# Patient Record
Sex: Male | Born: 1940 | Race: White | Hispanic: No | Marital: Single | Smoking: Never smoker
Health system: Southern US, Community
[De-identification: ages and names within clinical notes are randomized; demographics above are authoritative.]

## PROBLEM LIST (undated history)

## (undated) DIAGNOSIS — I10 Essential (primary) hypertension: Secondary | ICD-10-CM

## (undated) DIAGNOSIS — J45909 Unspecified asthma, uncomplicated: Secondary | ICD-10-CM

## (undated) DIAGNOSIS — M1990 Unspecified osteoarthritis, unspecified site: Secondary | ICD-10-CM

## (undated) DIAGNOSIS — R32 Unspecified urinary incontinence: Secondary | ICD-10-CM

## (undated) DIAGNOSIS — D649 Anemia, unspecified: Secondary | ICD-10-CM

## (undated) DIAGNOSIS — C801 Malignant (primary) neoplasm, unspecified: Secondary | ICD-10-CM

## (undated) HISTORY — PX: ESOPHAGOGASTRODUODENOSCOPY: SHX1529

## (undated) HISTORY — PX: COLONOSCOPY: SHX5424

## (undated) HISTORY — PX: COLOSTOMY: SHX63

## (undated) HISTORY — PX: CATARACT EXTRACTION: SUR2

---

## 2020-08-26 ENCOUNTER — Other Ambulatory Visit: Payer: Self-pay

## 2020-08-26 ENCOUNTER — Encounter: Payer: Self-pay | Admitting: Plastic Surgery

## 2020-08-26 ENCOUNTER — Ambulatory Visit (INDEPENDENT_AMBULATORY_CARE_PROVIDER_SITE_OTHER): Payer: Medicare Other | Admitting: Plastic Surgery

## 2020-08-26 VITALS — BP 138/72 | HR 89 | Ht 71.0 in | Wt 159.8 lb

## 2020-08-26 DIAGNOSIS — S61401A Unspecified open wound of right hand, initial encounter: Secondary | ICD-10-CM

## 2020-08-26 NOTE — H&P (View-Only) (Signed)
   Referring Provider No referring provider defined for this encounter.   CC:  Chief Complaint  Patient presents with  . Advice Only      Tony Fleming is an 80 y.o. male.  HPI: Patient presents with a dorsal right hand defect.  He says he has had a lesion on his dorsal hand for quite some time.  Apparently was excised somewhere else initially but a wound developed and it never healed.  He got wound care at an outside wound care center for period of time until he was sent to Dr. Winifred Olive.  He performed very aggressive resection yesterday exposing tendons and bone and feels like he obtain negative margins but it did go down to require the tumor being peeled off the tendon and bones.  He seem to be for reconstruction feels like he needs radiation as well.  Allergies  Allergen Reactions  . Penicillins Other (See Comments)    Tolerated ceftriaxone at OSH     Outpatient Encounter Medications as of 08/26/2020  Medication Sig  . traMADol (ULTRAM) 50 MG tablet Take 1 tablet by mouth every 6 (six) hours as needed.   No facility-administered encounter medications on file as of 08/26/2020.     No past medical history on file.  No family history on file.  Social History   Social History Narrative  . Not on file     Review of Systems General: Denies fevers, chills, weight loss CV: Denies chest pain, shortness of breath, palpitations  Physical Exam Vitals with BMI 08/26/2020  Height 5\' 11"   Weight 159 lbs 13 oz  BMI 77.8  Systolic 242  Diastolic 72  Pulse 89    General:  No acute distress,  Alert and oriented, Non-Toxic, Normal speech and affect Right hand: Fingers well-perfused with normal capillary refill to palp radial pulse.  Sensation intact throughout.  He has limited flexion and extension of his fingers.  He and his niece stated he has some stiffness from being immobilized for a long period of time while wound care is being done prior to his Mohs excision.  There is a large  dorsal defect with exposure of the extensor tendons.  All the extensor tendons do appear to be intact.  I estimate the size of the defect is 10 x 10 cm. Abdomen: Abdomen is soft nontender.  He has a colostomy bag on the left side.  There is no scars in the right groin or lower abdomen.  Assessment/Plan Patient presents with a dorsal right hand defect.  This is a complex defect with exposed tendon and bone.  Given that he has been recommended undergo radiation therapy I think a flap would be required for him.  I discussed abdominal and groin flap which would be pedicled.  I explained his hand will be sewn to the flap for at least 3 weeks and then will be divided at a later time.  He has limited hearing but is otherwise healthy.  He would require assistance postoperatively with activities of daily living and I think we need to be in a skilled nursing facility.  I discussed the risk of the procedure with him and his niece that include bleeding, infection, damage to surrounding structures and need for additional procedures.  All her questions were answered and we will plan to move forward.  [name] 08/26/2020, 4:07 PM

## 2020-08-26 NOTE — Progress Notes (Signed)
   Referring Provider No referring provider defined for this encounter.   CC:  Chief Complaint  Patient presents with  . Advice Only      Tony Fleming is an 80 y.o. male.  HPI: Patient presents with a dorsal right hand defect.  He says he has had a lesion on his dorsal hand for quite some time.  Apparently was excised somewhere else initially but a wound developed and it never healed.  He got wound care at an outside wound care center for period of time until he was sent to Dr. Mitkov.  He performed very aggressive resection yesterday exposing tendons and bone and feels like he obtain negative margins but it did go down to require the tumor being peeled off the tendon and bones.  He seem to be for reconstruction feels like he needs radiation as well.  Allergies  Allergen Reactions  . Penicillins Other (See Comments)    Tolerated ceftriaxone at OSH     Outpatient Encounter Medications as of 08/26/2020  Medication Sig  . traMADol (ULTRAM) 50 MG tablet Take 1 tablet by mouth every 6 (six) hours as needed.   No facility-administered encounter medications on file as of 08/26/2020.     No past medical history on file.  No family history on file.  Social History   Social History Narrative  . Not on file     Review of Systems General: Denies fevers, chills, weight loss CV: Denies chest pain, shortness of breath, palpitations  Physical Exam Vitals with BMI 08/26/2020  Height 5' 11"  Weight 159 lbs 13 oz  BMI 22.3  Systolic 138  Diastolic 72  Pulse 89    General:  No acute distress,  Alert and oriented, Non-Toxic, Normal speech and affect Right hand: Fingers well-perfused with normal capillary refill to palp radial pulse.  Sensation intact throughout.  He has limited flexion and extension of his fingers.  He and his niece stated he has some stiffness from being immobilized for a long period of time while wound care is being done prior to his Mohs excision.  There is a large  dorsal defect with exposure of the extensor tendons.  All the extensor tendons do appear to be intact.  I estimate the size of the defect is 10 x 10 cm. Abdomen: Abdomen is soft nontender.  He has a colostomy bag on the left side.  There is no scars in the right groin or lower abdomen.  Assessment/Plan Patient presents with a dorsal right hand defect.  This is a complex defect with exposed tendon and bone.  Given that he has been recommended undergo radiation therapy I think a flap would be required for him.  I discussed abdominal and groin flap which would be pedicled.  I explained his hand will be sewn to the flap for at least 3 weeks and then will be divided at a later time.  He has limited hearing but is otherwise healthy.  He would require assistance postoperatively with activities of daily living and I think we need to be in a skilled nursing facility.  I discussed the risk of the procedure with him and his niece that include bleeding, infection, damage to surrounding structures and need for additional procedures.  All her questions were answered and we will plan to move forward.  Kiffany Schelling S Nason Conradt 08/26/2020, 4:07 PM      

## 2020-08-27 ENCOUNTER — Other Ambulatory Visit: Payer: Self-pay

## 2020-08-27 ENCOUNTER — Encounter (HOSPITAL_COMMUNITY): Payer: Self-pay | Admitting: Plastic Surgery

## 2020-08-27 NOTE — Progress Notes (Signed)
PCP - Dr Manuela Schwartz - Cori Razor, [place] Cardiologist - denies   Chest x-ray - not needed EKG - DOS Stress Test - denies ECHO -  Cardiac Cath -    COVID TEST- needs DOS   Anesthesia review: NO, Needs Care Management consult for SNF placement  Patient denies shortness of breath, fever, cough and chest pain at PAT appointment   All instructions explained to the patient, with a verbal understanding of the material. Patient agrees to go over the instructions while at home for a better understanding. Patient also instructed to self quarantine after being tested for COVID-19. The opportunity to ask questions was provided.

## 2020-08-30 ENCOUNTER — Other Ambulatory Visit: Payer: Self-pay

## 2020-08-30 ENCOUNTER — Inpatient Hospital Stay (HOSPITAL_COMMUNITY): Payer: Medicare Other | Admitting: Certified Registered Nurse Anesthetist

## 2020-08-30 ENCOUNTER — Encounter (HOSPITAL_COMMUNITY): Payer: Self-pay | Admitting: Plastic Surgery

## 2020-08-30 ENCOUNTER — Encounter (HOSPITAL_COMMUNITY)
Admission: RE | Disposition: A | Discharge status: Skilled Nursing Facility | Payer: Self-pay | Source: Home / Self Care | Attending: Plastic Surgery

## 2020-08-30 ENCOUNTER — Inpatient Hospital Stay (HOSPITAL_COMMUNITY)
Admission: RE | Admit: 2020-08-30 | Discharge: 2020-09-13 | DRG: 578 | Disposition: A | Discharge status: Skilled Nursing Facility | Payer: Medicare Other | Attending: Plastic Surgery | Admitting: Plastic Surgery

## 2020-08-30 DIAGNOSIS — R198 Other specified symptoms and signs involving the digestive system and abdomen: Secondary | ICD-10-CM

## 2020-08-30 DIAGNOSIS — M1990 Unspecified osteoarthritis, unspecified site: Secondary | ICD-10-CM | POA: Diagnosis present

## 2020-08-30 DIAGNOSIS — Z79899 Other long term (current) drug therapy: Secondary | ICD-10-CM | POA: Diagnosis not present

## 2020-08-30 DIAGNOSIS — Z79891 Long term (current) use of opiate analgesic: Secondary | ICD-10-CM

## 2020-08-30 DIAGNOSIS — C44391 Other specified malignant neoplasm of skin of nose: Secondary | ICD-10-CM | POA: Diagnosis present

## 2020-08-30 DIAGNOSIS — I10 Essential (primary) hypertension: Secondary | ICD-10-CM | POA: Diagnosis present

## 2020-08-30 DIAGNOSIS — Z20822 Contact with and (suspected) exposure to covid-19: Secondary | ICD-10-CM | POA: Diagnosis present

## 2020-08-30 DIAGNOSIS — Z8572 Personal history of non-Hodgkin lymphomas: Secondary | ICD-10-CM

## 2020-08-30 DIAGNOSIS — Z933 Colostomy status: Secondary | ICD-10-CM | POA: Diagnosis not present

## 2020-08-30 DIAGNOSIS — R110 Nausea: Secondary | ICD-10-CM

## 2020-08-30 DIAGNOSIS — K5900 Constipation, unspecified: Secondary | ICD-10-CM | POA: Diagnosis present

## 2020-08-30 DIAGNOSIS — S61401D Unspecified open wound of right hand, subsequent encounter: Secondary | ICD-10-CM | POA: Diagnosis present

## 2020-08-30 DIAGNOSIS — S61401A Unspecified open wound of right hand, initial encounter: Secondary | ICD-10-CM | POA: Diagnosis present

## 2020-08-30 HISTORY — DX: Essential (primary) hypertension: I10

## 2020-08-30 HISTORY — DX: Unspecified urinary incontinence: R32

## 2020-08-30 HISTORY — DX: Unspecified osteoarthritis, unspecified site: M19.90

## 2020-08-30 HISTORY — DX: Unspecified asthma, uncomplicated: J45.909

## 2020-08-30 HISTORY — PX: MUSCLE FLAP CLOSURE: SHX2054

## 2020-08-30 HISTORY — DX: Anemia, unspecified: D64.9

## 2020-08-30 HISTORY — PX: DEBRIDEMENT AND CLOSURE WOUND: SHX5614

## 2020-08-30 HISTORY — DX: Malignant (primary) neoplasm, unspecified: C80.1

## 2020-08-30 LAB — CBC
HCT: 31.4 % — ABNORMAL LOW (ref 39.0–52.0)
Hemoglobin: 10.4 g/dL — ABNORMAL LOW (ref 13.0–17.0)
MCH: 31.2 pg (ref 26.0–34.0)
MCHC: 33.1 g/dL (ref 30.0–36.0)
MCV: 94.3 fL (ref 80.0–100.0)
Platelets: 185 10*3/uL (ref 150–400)
RBC: 3.33 MIL/uL — ABNORMAL LOW (ref 4.22–5.81)
RDW: 13.4 % (ref 11.5–15.5)
WBC: 5.2 10*3/uL (ref 4.0–10.5)
nRBC: 0 % (ref 0.0–0.2)

## 2020-08-30 LAB — RESP PANEL BY RT-PCR (FLU A&B, COVID) ARPGX2
Influenza A by PCR: NEGATIVE
Influenza B by PCR: NEGATIVE
SARS Coronavirus 2 by RT PCR: NEGATIVE

## 2020-08-30 LAB — BASIC METABOLIC PANEL
Anion gap: 5 (ref 5–15)
BUN: 16 mg/dL (ref 8–23)
CO2: 26 mmol/L (ref 22–32)
Calcium: 8.6 mg/dL — ABNORMAL LOW (ref 8.9–10.3)
Chloride: 107 mmol/L (ref 98–111)
Creatinine, Ser: 1.15 mg/dL (ref 0.61–1.24)
GFR, Estimated: >60 mL/min (ref >60)
Glucose, Bld: 100 mg/dL — ABNORMAL HIGH (ref 70–99)
Potassium: 3.5 mmol/L (ref 3.5–5.1)
Sodium: 138 mmol/L (ref 135–145)

## 2020-08-30 SURGERY — CLOSURE, WOUND, USING MUSCLE FLAP
Anesthesia: General | Site: Hand | Laterality: Right

## 2020-08-30 MED ORDER — ROCURONIUM BROMIDE 10 MG/ML (PF) SYRINGE
PREFILLED_SYRINGE | INTRAVENOUS | Status: AC
  Filled 2020-08-30: qty 10

## 2020-08-30 MED ORDER — ONDANSETRON 4 MG PO TBDP
4.0000 mg | ORAL_TABLET | Freq: Four times a day (QID) | ORAL | Status: DC | PRN
  Filled 2020-08-30: qty 1

## 2020-08-30 MED ORDER — HYDROCODONE-ACETAMINOPHEN 5-325 MG PO TABS
1.0000 | ORAL_TABLET | ORAL | Status: DC | PRN
  Administered 2020-08-30: 1 via ORAL
  Administered 2020-08-31: 2 via ORAL
  Administered 2020-09-01 – 2020-09-03 (×3): 1 via ORAL
  Administered 2020-09-03: 2 via ORAL
  Administered 2020-09-04 – 2020-09-10 (×5): 1 via ORAL
  Filled 2020-08-30 (×4): qty 1
  Filled 2020-08-30: qty 2
  Filled 2020-08-30 (×5): qty 1
  Filled 2020-08-30: qty 2

## 2020-08-30 MED ORDER — ONDANSETRON HCL 4 MG/2ML IJ SOLN
INTRAMUSCULAR | Status: DC | PRN
  Administered 2020-08-30: 4 mg via INTRAVENOUS

## 2020-08-30 MED ORDER — ALBUMIN HUMAN 5 % IV SOLN: INTRAVENOUS | Status: DC | PRN

## 2020-08-30 MED ORDER — DEXAMETHASONE SODIUM PHOSPHATE 10 MG/ML IJ SOLN
INTRAMUSCULAR | Status: DC | PRN
  Administered 2020-08-30: 5 mg via INTRAVENOUS

## 2020-08-30 MED ORDER — ONDANSETRON 4 MG PO TBDP: 4.0000 mg | ORAL_TABLET | Freq: Four times a day (QID) | ORAL | Status: DC | PRN

## 2020-08-30 MED ORDER — FENTANYL CITRATE (PF) 100 MCG/2ML IJ SOLN: 25.0000 ug | INTRAMUSCULAR | Status: DC | PRN

## 2020-08-30 MED ORDER — PROPOFOL 10 MG/ML IV BOLUS
INTRAVENOUS | Status: AC
  Filled 2020-08-30: qty 20

## 2020-08-30 MED ORDER — BUPIVACAINE-EPINEPHRINE (PF) 0.25% -1:200000 IJ SOLN
INTRAMUSCULAR | Status: AC
  Filled 2020-08-30: qty 30

## 2020-08-30 MED ORDER — CHLORHEXIDINE GLUCONATE 0.12 % MT SOLN
15.0000 mL | Freq: Once | OROMUCOSAL | Status: AC
  Administered 2020-08-30: 30 mL via OROMUCOSAL
  Filled 2020-08-30: qty 15

## 2020-08-30 MED ORDER — ACETAMINOPHEN 325 MG PO TABS
325.0000 mg | ORAL_TABLET | Freq: Four times a day (QID) | ORAL | Status: DC
  Administered 2020-08-30 – 2020-09-13 (×44): 325 mg via ORAL
  Filled 2020-08-30 (×46): qty 1

## 2020-08-30 MED ORDER — IBUPROFEN 200 MG PO TABS
600.0000 mg | ORAL_TABLET | Freq: Four times a day (QID) | ORAL | Status: DC | PRN
  Administered 2020-09-05 – 2020-09-06 (×2): 600 mg via ORAL
  Filled 2020-08-30 (×2): qty 3

## 2020-08-30 MED ORDER — MORPHINE SULFATE (PF) 2 MG/ML IV SOLN: 1.0000 mg | INTRAVENOUS | Status: DC | PRN

## 2020-08-30 MED ORDER — ORAL CARE MOUTH RINSE: 15.0000 mL | Freq: Once | OROMUCOSAL | Status: AC

## 2020-08-30 MED ORDER — DEXAMETHASONE SODIUM PHOSPHATE 10 MG/ML IJ SOLN
INTRAMUSCULAR | Status: AC
  Filled 2020-08-30: qty 3

## 2020-08-30 MED ORDER — PHENYLEPHRINE 40 MCG/ML (10ML) SYRINGE FOR IV PUSH (FOR BLOOD PRESSURE SUPPORT)
PREFILLED_SYRINGE | INTRAVENOUS | Status: AC
  Filled 2020-08-30: qty 10

## 2020-08-30 MED ORDER — LIDOCAINE 2% (20 MG/ML) 5 ML SYRINGE
INTRAMUSCULAR | Status: AC
  Filled 2020-08-30: qty 10

## 2020-08-30 MED ORDER — CLINDAMYCIN PHOSPHATE 900 MG/50ML IV SOLN
900.0000 mg | INTRAVENOUS | Status: AC
  Administered 2020-08-30: 900 mg via INTRAVENOUS
  Filled 2020-08-30: qty 50

## 2020-08-30 MED ORDER — PROPOFOL 10 MG/ML IV BOLUS
INTRAVENOUS | Status: DC | PRN
  Administered 2020-08-30: 130 mg via INTRAVENOUS

## 2020-08-30 MED ORDER — LIDOCAINE-EPINEPHRINE 1 %-1:100000 IJ SOLN
INTRAMUSCULAR | Status: DC | PRN
  Administered 2020-08-30: 20 mL

## 2020-08-30 MED ORDER — FENTANYL CITRATE (PF) 250 MCG/5ML IJ SOLN
INTRAMUSCULAR | Status: AC
  Filled 2020-08-30: qty 5

## 2020-08-30 MED ORDER — LISINOPRIL 10 MG PO TABS
10.0000 mg | ORAL_TABLET | Freq: Every day | ORAL | Status: DC
  Administered 2020-08-30 – 2020-09-13 (×15): 10 mg via ORAL
  Filled 2020-08-30 (×15): qty 1

## 2020-08-30 MED ORDER — POLYETHYLENE GLYCOL 3350 17 G PO PACK: 17.0000 g | PACK | Freq: Every day | ORAL | Status: DC | PRN

## 2020-08-30 MED ORDER — LACTATED RINGERS IV SOLN: INTRAVENOUS | Status: DC

## 2020-08-30 MED ORDER — AMISULPRIDE (ANTIEMETIC) 5 MG/2ML IV SOLN: 10.0000 mg | Freq: Once | INTRAVENOUS | Status: DC | PRN

## 2020-08-30 MED ORDER — HYDROCODONE-ACETAMINOPHEN 5-325 MG PO TABS: 1.0000 | ORAL_TABLET | Freq: Four times a day (QID) | ORAL | Status: DC | PRN

## 2020-08-30 MED ORDER — LIDOCAINE-EPINEPHRINE 1 %-1:100000 IJ SOLN
INTRAMUSCULAR | Status: AC
  Filled 2020-08-30: qty 1

## 2020-08-30 MED ORDER — EPHEDRINE SULFATE-NACL 50-0.9 MG/10ML-% IV SOSY
PREFILLED_SYRINGE | INTRAVENOUS | Status: DC | PRN
  Administered 2020-08-30 (×3): 5 mg via INTRAVENOUS

## 2020-08-30 MED ORDER — BUPIVACAINE-EPINEPHRINE (PF) 0.25% -1:200000 IJ SOLN
INTRAMUSCULAR | Status: DC | PRN
  Administered 2020-08-30: 30 mL

## 2020-08-30 MED ORDER — ENOXAPARIN SODIUM 40 MG/0.4ML IJ SOSY: 40.0000 mg | PREFILLED_SYRINGE | INTRAMUSCULAR | Status: DC

## 2020-08-30 MED ORDER — MECLIZINE HCL 12.5 MG PO TABS
12.5000 mg | ORAL_TABLET | Freq: Three times a day (TID) | ORAL | Status: DC | PRN
  Filled 2020-08-30: qty 1

## 2020-08-30 MED ORDER — ONDANSETRON HCL 4 MG/2ML IJ SOLN: 4.0000 mg | Freq: Once | INTRAMUSCULAR | Status: DC | PRN

## 2020-08-30 MED ORDER — LIDOCAINE 2% (20 MG/ML) 5 ML SYRINGE
INTRAMUSCULAR | Status: DC | PRN
  Administered 2020-08-30: 80 mg via INTRAVENOUS

## 2020-08-30 MED ORDER — SODIUM CHLORIDE 0.9 % IR SOLN
Status: DC | PRN
  Administered 2020-08-30: 3000 mL

## 2020-08-30 MED ORDER — ONDANSETRON HCL 4 MG/2ML IJ SOLN
INTRAMUSCULAR | Status: AC
  Filled 2020-08-30: qty 6

## 2020-08-30 MED ORDER — ONDANSETRON HCL 4 MG/2ML IJ SOLN: 4.0000 mg | Freq: Four times a day (QID) | INTRAMUSCULAR | Status: DC | PRN

## 2020-08-30 MED ORDER — PHENYLEPHRINE 40 MCG/ML (10ML) SYRINGE FOR IV PUSH (FOR BLOOD PRESSURE SUPPORT)
PREFILLED_SYRINGE | INTRAVENOUS | Status: DC | PRN
  Administered 2020-08-30 (×3): 40 ug via INTRAVENOUS

## 2020-08-30 MED ORDER — ACETAMINOPHEN 10 MG/ML IV SOLN: 1000.0000 mg | Freq: Once | INTRAVENOUS | Status: DC | PRN

## 2020-08-30 MED ORDER — FENTANYL CITRATE (PF) 250 MCG/5ML IJ SOLN
INTRAMUSCULAR | Status: DC | PRN
  Administered 2020-08-30 (×2): 25 ug via INTRAVENOUS

## 2020-08-30 SURGICAL SUPPLY — 102 items
ADH SKN CLS APL DERMABOND .7 (GAUZE/BANDAGES/DRESSINGS)
APL PRP STRL LF DISP 70% ISPRP (MISCELLANEOUS)
APL SKNCLS STERI-STRIP NONHPOA (GAUZE/BANDAGES/DRESSINGS) ×2
APPLIER CLIP 9.375 MED OPEN (MISCELLANEOUS)
APR CLP MED 9.3 20 MLT OPN (MISCELLANEOUS)
BENZOIN TINCTURE PRP APPL 2/3 (GAUZE/BANDAGES/DRESSINGS) ×3 IMPLANT
BINDER ABDOMINAL 12 ML 46-62 (SOFTGOODS) ×6 IMPLANT
BIOPATCH RED 1 DISK 7.0 (GAUZE/BANDAGES/DRESSINGS) IMPLANT
BLADE CLIPPER SURG (BLADE) ×3 IMPLANT
BLADE SURG 10 STRL SS (BLADE) ×3 IMPLANT
BLADE SURG 11 STRL SS (BLADE) ×3 IMPLANT
BLADE SURG 15 STRL LF DISP TIS (BLADE) IMPLANT
BLADE SURG 15 STRL SS (BLADE)
BNDG CMPR 9X4 STRL LF SNTH (GAUZE/BANDAGES/DRESSINGS)
BNDG ELASTIC 3X5.8 VLCR STR LF (GAUZE/BANDAGES/DRESSINGS) IMPLANT
BNDG ELASTIC 4X5.8 VLCR STR LF (GAUZE/BANDAGES/DRESSINGS) IMPLANT
BNDG ESMARK 4X9 LF (GAUZE/BANDAGES/DRESSINGS) IMPLANT
BNDG GAUZE ELAST 4 BULKY (GAUZE/BANDAGES/DRESSINGS) IMPLANT
CANISTER SUCT 3000ML PPV (MISCELLANEOUS) ×3 IMPLANT
CHLORAPREP W/TINT 26 (MISCELLANEOUS) IMPLANT
CLIP APPLIE 9.375 MED OPEN (MISCELLANEOUS) IMPLANT
CONT SPEC PATH 64OZ SNAP LID (MISCELLANEOUS) IMPLANT
CORD BIPOLAR FORCEPS 12FT (ELECTRODE) IMPLANT
COVER SURGICAL LIGHT HANDLE (MISCELLANEOUS) ×3 IMPLANT
COVER WAND RF STERILE (DRAPES) IMPLANT
CUFF TOURN SGL QUICK 18X4 (TOURNIQUET CUFF) IMPLANT
DERMABOND ADVANCED (GAUZE/BANDAGES/DRESSINGS)
DERMABOND ADVANCED .7 DNX12 (GAUZE/BANDAGES/DRESSINGS) IMPLANT
DRAIN CHANNEL 15F RND FF W/TCR (WOUND CARE) IMPLANT
DRAPE HALF SHEET 40X57 (DRAPES) ×6 IMPLANT
DRAPE OEC MINIVIEW 54X84 (DRAPES) ×3 IMPLANT
DRAPE SURG 17X23 STRL (DRAPES) IMPLANT
DRAPE TOP ARMCOVERS (MISCELLANEOUS) IMPLANT
DRAPE U-SHAPE 76X120 STRL (DRAPES) IMPLANT
DRAPE UTILITY XL STRL (DRAPES) IMPLANT
DRSG MEPILEX BORDER 4X8 (GAUZE/BANDAGES/DRESSINGS) ×3 IMPLANT
DRSG PAD ABDOMINAL 8X10 ST (GAUZE/BANDAGES/DRESSINGS) ×6 IMPLANT
DRSG TEGADERM 4X4.75 (GAUZE/BANDAGES/DRESSINGS) IMPLANT
ELECT BLADE 4.0 EZ CLEAN MEGAD (MISCELLANEOUS)
ELECT COATED BLADE 2.86 ST (ELECTRODE) IMPLANT
ELECT REM PT RETURN 9FT ADLT (ELECTROSURGICAL) ×3
ELECTRODE BLDE 4.0 EZ CLN MEGD (MISCELLANEOUS) IMPLANT
ELECTRODE REM PT RTRN 9FT ADLT (ELECTROSURGICAL) ×2 IMPLANT
EVACUATOR SILICONE 100CC (DRAIN) IMPLANT
GAUZE SPONGE 4X4 12PLY STRL (GAUZE/BANDAGES/DRESSINGS) IMPLANT
GAUZE XEROFORM 1X8 LF (GAUZE/BANDAGES/DRESSINGS) IMPLANT
GLOVE BIOGEL M STRL SZ7.5 (GLOVE) ×3 IMPLANT
GLOVE SRG 8 PF TXTR STRL LF DI (GLOVE) ×2 IMPLANT
GLOVE SURG UNDER POLY LF SZ8 (GLOVE) ×3
GOWN STRL REUS W/ TWL LRG LVL3 (GOWN DISPOSABLE) ×4 IMPLANT
GOWN STRL REUS W/TWL LRG LVL3 (GOWN DISPOSABLE) ×6
HANDPIECE INTERPULSE COAX TIP (DISPOSABLE) ×3
KIT BASIN OR (CUSTOM PROCEDURE TRAY) ×3 IMPLANT
KIT TURNOVER KIT B (KITS) ×3 IMPLANT
LOOP VESSEL MAXI BLUE (MISCELLANEOUS) IMPLANT
NEEDLE 22X1 1/2 (OR ONLY) (NEEDLE) ×3 IMPLANT
NS IRRIG 1000ML POUR BTL (IV SOLUTION) ×3 IMPLANT
PACK GENERAL/GYN (CUSTOM PROCEDURE TRAY) ×3 IMPLANT
PACK ORTHO EXTREMITY (CUSTOM PROCEDURE TRAY) IMPLANT
PACK UNIVERSAL I (CUSTOM PROCEDURE TRAY) ×3 IMPLANT
PAD ABD 8X10 STRL (GAUZE/BANDAGES/DRESSINGS) ×12 IMPLANT
PAD ARMBOARD 7.5X6 YLW CONV (MISCELLANEOUS) ×6 IMPLANT
PAD CAST 3X4 CTTN HI CHSV (CAST SUPPLIES) IMPLANT
PAD CAST 4YDX4 CTTN HI CHSV (CAST SUPPLIES) IMPLANT
PADDING CAST COTTON 3X4 STRL (CAST SUPPLIES)
PADDING CAST COTTON 4X4 STRL (CAST SUPPLIES)
PENCIL SMOKE EVACUATOR (MISCELLANEOUS) ×3 IMPLANT
PIN SAFETY STERILE (MISCELLANEOUS) IMPLANT
SET HNDPC FAN SPRY TIP SCT (DISPOSABLE) ×2 IMPLANT
SHEET MEDIUM DRAPE 40X70 STRL (DRAPES) IMPLANT
SLEEVE SCD COMPRESS KNEE MED (STOCKING) IMPLANT
SLING ARM FOAM STRAP LRG (SOFTGOODS) ×3 IMPLANT
SOL PREP PROV IODINE SCRUB 4OZ (MISCELLANEOUS) IMPLANT
SPONGE LAP 18X18 RF (DISPOSABLE) ×6 IMPLANT
SPONGE LAP 4X18 RFD (DISPOSABLE) IMPLANT
STAPLER INSORB 30 2030 C-SECTI (MISCELLANEOUS) IMPLANT
STAPLER VISISTAT 35W (STAPLE) ×3 IMPLANT
STRIP CLOSURE SKIN 1/2X4 (GAUZE/BANDAGES/DRESSINGS) ×3 IMPLANT
SUT ETHILON 3 0 PS 1 (SUTURE) IMPLANT
SUT MNCRL AB 3-0 PS2 27 (SUTURE) ×6 IMPLANT
SUT MNCRL AB 4-0 PS2 18 (SUTURE) IMPLANT
SUT MON AB 5-0 PS2 18 (SUTURE) IMPLANT
SUT PDS AB 0 CT 36 (SUTURE) IMPLANT
SUT PDS AB 2-0 CT2 27 (SUTURE) ×6 IMPLANT
SUT PDS AB 3-0 SH 27 (SUTURE) ×24 IMPLANT
SUT VIC AB 2-0 CT1 27 (SUTURE)
SUT VIC AB 2-0 CT1 TAPERPNT 27 (SUTURE) IMPLANT
SUT VIC AB 2-0 CTX 27 (SUTURE) IMPLANT
SUT VIC AB 3-0 FS2 27 (SUTURE) IMPLANT
SUT VLOC 90 P-14 23 (SUTURE) ×3 IMPLANT
SYR BULB IRRIG 60ML STRL (SYRINGE) ×3 IMPLANT
SYR CONTROL 10ML LL (SYRINGE) ×3 IMPLANT
SYSTEM CHEST DRAIN TLS 7FR (DRAIN) IMPLANT
TAPE PAPER 3X10 WHT MICROPORE (GAUZE/BANDAGES/DRESSINGS) ×3 IMPLANT
TOWEL GREEN STERILE (TOWEL DISPOSABLE) ×3 IMPLANT
TOWEL GREEN STERILE FF (TOWEL DISPOSABLE) ×3 IMPLANT
TRAY FOLEY W/BAG SLVR 16FR (SET/KITS/TRAYS/PACK)
TRAY FOLEY W/BAG SLVR 16FR ST (SET/KITS/TRAYS/PACK) IMPLANT
TUBE CONNECTING 12X1/4 (SUCTIONS) ×3 IMPLANT
TUBE EVACUATION TLS (MISCELLANEOUS) IMPLANT
UNDERPAD 30X36 HEAVY ABSORB (UNDERPADS AND DIAPERS) ×3 IMPLANT
WATER STERILE IRR 1000ML POUR (IV SOLUTION) ×3 IMPLANT

## 2020-08-30 NOTE — Plan of Care (Signed)

## 2020-08-30 NOTE — Op Note (Signed)
Operative Note   DATE OF OPERATION: 08/30/2020  SURGICAL DEPARTMENT: Plastic Surgery  PREOPERATIVE DIAGNOSES: Complex right dorsal hand defect  POSTOPERATIVE DIAGNOSES:  same  PROCEDURE: 1.  Surgical preparation for coverage of complex right dorsal hand defect totaling 12 x 7 cm 2.  Reconstruction right dorsal hand defect with superficial circumflex iliac artery fasciocutaneous flap  SURGEON: Talmadge Coventry, MD  ASSISTANT: Verdie Shire, PA The advanced practice practitioner (APP) assisted throughout the case.  The APP was essential in retraction and counter traction when needed to make the case progress smoothly.  This retraction and assistance made it possible to see the tissue plans for the procedure.  The assistance was needed for blood control, tissue re-approximation and assisted with closure of the incision site.  ANESTHESIA:  General.   COMPLICATIONS: None.   INDICATIONS FOR PROCEDURE:  The patient, Tony Fleming is a 80 y.o. 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  He was sent to me by his Mohs surgeon for reconstruction. MRN: [id number]  CONSENT:  Informed consent was obtained directly from the patient. Risks, benefits and alternatives were fully discussed. Specific risks including but not limited to bleeding, infection, hematoma, seroma, scarring, pain, contracture, asymmetry, wound healing problems, and need for further surgery were all discussed. The patient did have an ample opportunity to have questions answered to satisfaction.   DESCRIPTION OF PROCEDURE:  The patient was taken to the operating room. SCDs were placed and antibiotics were given.  General anesthesia was administered.  The patient's operative site was prepped and draped in a sterile fashion. A time out was performed and all information was confirmed to be correct.  Started by evaluating the wound.  This was a 12 x 7  cm wound with exposed extensor tendons to the index long and ring fingers.  The index and long finger metacarpal were also stripped to the periosteum.  There was exposed interosseous muscles and it appeared that some of the interosseous muscles between the index and long finger metacarpals had been excised.  All the tendons appear to be intact and were not desiccated.  I started by debriding the hand wound with pulse lavage.  Several liters were utilized.  Any devitalized tissue was excised with either scissors or a 15 blade.  I then formed a template of the wound and transposed this to his lateral abdomen.  I marked out the anterior superior iliac spine and the pubic tubercle.  Knowing that the superficial circumflex iliac artery ran deep to the fascia just inferior to the inguinal limit ligament I planned to the flap around these landmarks.  This then extended laterally on his flank.  Lidocaine and Marcaine were then injected circumferentially around the borders.  I then made the incision with a 10 blade and dissected down to the fascia throughout.  The flap was elevated just above the fascia until I reached approximately the level of the anterior superior iliac spine.  At this point I actually had enough length that I did not need to undermine any further.  The hand was then transposed to this area to ensure appropriate length and positioning which was the case.  I then closed the donor site by doing some limited circumferential undermining and closing that in layers with interrupted buried 3-0 PDS sutures in a running 3 OV lock.  At this point I planned the inset of the flap.  The tip was able to  be excised as I did have plenty of length and upon excision of the tip I did encounter healthy bright red bleeding coming from the distal aspect of the flap which was encouraging.  Inset was then done with interrupted 3-0 PDS mattress sutures.  I did use interrupted and running 3-0 PDS sutures to tack his index finger  and his distal forearm to his abdomen to try and secure the flap and arm in position so that he would not move it and avulse the flap.  Steri-Strips were then applied to the donor site closure along with 4 x 4's ABD and abdominal binders to keep his arm at his side.  The patient tolerated the procedure well.  There were no complications. The patient was allowed to wake from anesthesia, extubated and taken to the recovery room in satisfactory condition.

## 2020-08-30 NOTE — Interval H&P Note (Signed)
History and Physical Interval Note:  08/30/2020 11:23 AM  Tony Fleming  has presented today for surgery, with the diagnosis of Squamous Cell Carcinoma of right hand.  The various methods of treatment have been discussed with the patient and family. After consideration of risks, benefits and other options for treatment, the patient has consented to  Procedure(s) with comments: right hand reconstruction (Right) - 2 hours Abdominal flap/closure (N/A) as a surgical intervention.  The patient's history has been reviewed, patient examined, no change in status, stable for surgery.  I have reviewed the patient's chart and labs.  Questions were answered to the patient's satisfaction.     [name]

## 2020-08-30 NOTE — Anesthesia Preprocedure Evaluation (Addendum)
Anesthesia Evaluation  Patient identified by MRN, date of birth, ID band Patient awake    Reviewed: Allergy & Precautions, NPO status , Patient's Chart, lab work & pertinent test results  Airway Mallampati: III  TM Distance: >3 FB Neck ROM: Full    Dental  (+) Missing, Chipped, Poor Dentition   Pulmonary asthma ,    Pulmonary exam normal breath sounds clear to auscultation       Cardiovascular hypertension, Pt. on medications Normal cardiovascular exam Rhythm:Regular Rate:Normal  ECG: SR, rate 72   Neuro/Psych negative neurological ROS  negative psych ROS   GI/Hepatic negative GI ROS, Neg liver ROS,   Endo/Other  negative endocrine ROS  Renal/GU negative Renal ROS     Musculoskeletal  (+) Arthritis ,   Abdominal   Peds  Hematology  (+) anemia ,   Anesthesia Other Findings Squamous Cell Carcinoma of right hand  Reproductive/Obstetrics                            Anesthesia Physical Anesthesia Plan  ASA: II  Anesthesia Plan: General   Post-op Pain Management:    Induction: Intravenous  PONV Risk Score and Plan: 2 and Ondansetron, Dexamethasone and Treatment may vary due to age or medical condition  Airway Management Planned: LMA  Additional Equipment:   Intra-op Plan:   Post-operative Plan: Extubation in OR  Informed Consent: I have reviewed the patients History and Physical, chart, labs and discussed the procedure including the risks, benefits and alternatives for the proposed anesthesia with the patient or authorized representative who has indicated his/her understanding and acceptance.     Dental advisory given  Plan Discussed with: CRNA  Anesthesia Plan Comments:        Anesthesia Quick Evaluation

## 2020-08-30 NOTE — Transfer of Care (Signed)
Immediate Anesthesia Transfer of Care Note  Patient: Tony Fleming  Procedure(s) Performed: right hand reconstruction (Right Hand) Abdominal flap/closure (N/A Abdomen)  Patient Location: PACU  Anesthesia Type:General  Level of Consciousness: awake and drowsy  Airway & Oxygen Therapy: Patient Spontanous Breathing and Patient connected to face mask oxygen  Post-op Assessment: Report given to RN and Post -op Vital signs reviewed and stable  Post vital signs: Reviewed and stable  Last Vitals:  Vitals Value Taken Time  BP 111/84 08/30/20 1613  Temp    Pulse 86 08/30/20 1620  Resp 14 08/30/20 1620  SpO2 98 % 08/30/20 1620  Vitals shown include unvalidated device data.  Last Pain:  Vitals:   08/30/20 1110  PainSc: 0-No pain         Complications: No complications documented.

## 2020-08-30 NOTE — Brief Op Note (Signed)
08/30/2020  4:14 PM  PATIENT:  Tony Fleming  80 y.o. male  PRE-OPERATIVE DIAGNOSIS:  Squamous Cell Carcinoma of right hand  POST-OPERATIVE DIAGNOSIS:  Squamous Cell Carcinoma of right hand  PROCEDURE:  Procedure(s) with comments: right hand reconstruction (Right) - 2 hours Abdominal flap/closure (N/A)  SURGEON:  Surgeon(s) and Role:    * Quantrell Splitt, Steffanie Dunn, MD - Primary  PHYSICIAN ASSISTANT: Software engineer, PA  ASSISTANTS: none   ANESTHESIA:   general  EBL:  5 mL   BLOOD ADMINISTERED:none  DRAINS: none   LOCAL MEDICATIONS USED:  MARCAINE     SPECIMEN:  No Specimen  DISPOSITION OF SPECIMEN:  N/A  COUNTS:  YES  TOURNIQUET:  * No tourniquets in log *  DICTATION: .Dragon Dictation  PLAN OF CARE: Admit to inpatient  PATIENT DISPOSITION:  PACU - hemodynamically stable.   Delay start of Pharmacological VTE agent (>24hrs) due to surgical blood loss or risk of bleeding: not applicable

## 2020-08-30 NOTE — Anesthesia Procedure Notes (Addendum)
Procedure Name: LMA Insertion Date/Time: 08/30/2020 2:17 PM Performed by: Janene Harvey, CRNA Pre-anesthesia Checklist: Patient identified, Emergency Drugs available, Suction available and Patient being monitored Patient Re-evaluated:Patient Re-evaluated prior to induction Oxygen Delivery Method: Circle system utilized Preoxygenation: Pre-oxygenation with 100% oxygen Induction Type: IV induction Ventilation: Mask ventilation without difficulty LMA: LMA inserted LMA Size: 4.0 Number of attempts: 1 Placement Confirmation: ETT inserted through vocal cords under direct vision,  positive ETCO2 and breath sounds checked- equal and bilateral Tube secured with: Tape Dental Injury: Teeth and Oropharynx as per pre-operative assessment  Comments: Placed by [facility]

## 2020-08-31 ENCOUNTER — Encounter (HOSPITAL_COMMUNITY): Payer: Self-pay | Admitting: Plastic Surgery

## 2020-08-31 ENCOUNTER — Telehealth: Payer: Self-pay

## 2020-08-31 MED ORDER — ENOXAPARIN SODIUM 40 MG/0.4ML IJ SOSY
40.0000 mg | PREFILLED_SYRINGE | INTRAMUSCULAR | Status: DC
  Administered 2020-09-01 – 2020-09-12 (×12): 40 mg via SUBCUTANEOUS
  Filled 2020-08-31 (×12): qty 0.4

## 2020-08-31 MED ORDER — ENOXAPARIN SODIUM 40 MG/0.4ML IJ SOSY: 40.0000 mg | PREFILLED_SYRINGE | INTRAMUSCULAR | Status: DC

## 2020-08-31 NOTE — Progress Notes (Signed)
CSW received SNF consult. CSW requested PT/OT consult from RN as those would be needed for SNF approval.   Cedric Fishman LCSW

## 2020-08-31 NOTE — Progress Notes (Signed)
1 Day Post-Op  Subjective: 1 day postop from debridement of right hand wound and right hand reconstruction with abdominal fasciocutaneous flap with Dr. Claudia Desanctis.  Patient is resting in bed this morning on evaluation.  He reports mild pain but reports pain medications have been very helpful.  He denies any issues at this time.  No fevers, chills, nausea, vomiting.  No chest pain or shortness of breath.  He is aware that we are working on placement for SNF  Objective: Vital signs in last 24 hours: Temp:  [97 F (36.1 C)-98 F (36.7 C)] 97.9 F (36.6 C) (05/10 0700) Pulse Rate:  [64-89] 71 (05/10 0700) Resp:  [11-19] 18 (05/10 0700) BP: (111-157)/(55-84) 135/58 (05/10 0700) SpO2:  [95 %-100 %] 100 % (05/10 0700) Weight:  [72.5 kg] 72.5 kg (05/09 1054)    Intake/Output from previous day: 05/09 0701 - 05/10 0700 In: 1000 [I.V.:700; IV Piggyback:300] Out: 5 [Blood:5] Intake/Output this shift: No intake/output data recorded.  General appearance: alert, cooperative, no distress and Resting in bed Resp: Unlabored GI: Colostomy bag left abdomen, right hand with abdominal fasciocutaneous flap in place.  Normal temperature of flap noted.  Minimal tenderness palpation.  No erythema noted.  Small amount of residual oozing from exposed flap noted on gauze.  No purulent drainage noted.  Swelling of fasciocutaneous flap noted.  No necrosis noted. Extremities: SCDs in place.  No swelling noted.   Lab Results:  CBC Latest Ref Rng & Units 08/30/2020  WBC 4.0 - 10.5 K/uL 5.2  Hemoglobin 13.0 - 17.0 g/dL 10.4(L)  Hematocrit 39.0 - 52.0 % 31.4(L)  Platelets 150 - 400 K/uL 185    BMET Recent Labs    08/30/20 1032  NA 138  K 3.5  CL 107  CO2 26  GLUCOSE 100*  BUN 16  CREATININE 1.15  CALCIUM 8.6*   PT/INR No results for input(s): LABPROT, INR in the last 72 hours. ABG No results for input(s): PHART, HCO3 in the last 72 hours.  Invalid input(s): PCO2, PO2  Studies/Results: No results  found.  Anti-infectives: Anti-infectives (From admission, onward)   Start     Dose/Rate Route Frequency Ordered Stop   08/30/20 1045  clindamycin (CLEOCIN) IVPB 900 mg        900 mg 100 mL/hr over 30 Minutes Intravenous On call to O.R. 08/30/20 1030 08/30/20 1420      Assessment/Plan: s/p Procedure(s): right hand reconstruction Abdominal flap/closure  SCDs for DVT prophylaxis.  Will start Lovenox this afternoon. Normal diet. Pain control with Norco, ibuprofen, Tylenol.  As needed morphine for severe pain.  Patient's right dorsal hand fasciocutaneous flap is overall doing well, appears stable.  Keep abdominal binder is in place to prevent patient from pulling right hand from fasciocutaneous flap site.  There is no sign of infection, seroma, hematoma.  Everything appears stable.  Working with case management for placement at SNF for recovery after surgery due to patient's limitations.  Patient currently lives alone and would be unable to care for himself as his right hand is sutured to his abdominal flap. Patient is expected to stay greater than 2 midnights.   LOS: 1 day    Charlies Constable, PA-C 08/31/2020

## 2020-08-31 NOTE — Telephone Encounter (Signed)
Call from Sam, [place] -  5N/Orthopedics regarding pt for the following : Reported that there was blood that had saturated the dressings & on the bed sheet. At the time I returned the call to Sam there was no active bleeding, and he reinforced the dressings. He indicated that the incision appears intact and the patient has had no other complications. The patient has minimal pain and is doing well. I informed the RN to call us with any other changes/concerns.

## 2020-09-01 ENCOUNTER — Telehealth: Payer: Self-pay

## 2020-09-01 NOTE — Plan of Care (Signed)

## 2020-09-01 NOTE — Plan of Care (Signed)

## 2020-09-01 NOTE — Telephone Encounter (Signed)
Call from  [place] regarding the following: She would like orders/instructions for OT therapy for this pt She also wanted to discuss possible use of a " moisture wicking" dressing called Interdry.  This is used to wick away moisture from peri-wound areas. This material can stay in place for up to 5 days & will assist in preventing peri-wound skin from "breakdown/maceration".  She will use this dressing near his thumb/hip area & near axilla/ trunk area. He has moisture/drainage from these sites d/t the surgical/incision/ flap.  I consulted with Dr. Claudia Desanctis & agrees with the use of the Interdry in the above areas. He also instructed OT that the pt can be up/walking & can move/manipulate his fingers- but nothing that would compromise the incisions/flap reconstruction. The above was given to [facility] as verbal order.
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
## 2020-09-01 NOTE — Anesthesia Postprocedure Evaluation (Signed)
Anesthesia Post Note  Patient: Tony Fleming  Procedure(s) Performed: right hand reconstruction (Right Hand) Abdominal flap/closure (N/A Abdomen)     Patient location during evaluation: PACU Anesthesia Type: General Level of consciousness: awake Pain management: pain level controlled Vital Signs Assessment: post-procedure vital signs reviewed and stable Respiratory status: spontaneous breathing, nonlabored ventilation, respiratory function stable and patient connected to nasal cannula oxygen Cardiovascular status: blood pressure returned to baseline and stable Postop Assessment: no apparent nausea or vomiting Anesthetic complications: no   No complications documented.  Last Vitals:  Vitals:   08/31/20 1958 09/01/20 0600  BP: (!) 154/63 (!) 153/74  Pulse: 85 84  Resp: 18 17  Temp: 36.6 C 36.7 C  SpO2: 96% 97%    Last Pain:  Vitals:   09/01/20 0600  TempSrc: Oral  PainSc: 0-No pain                 Jerone Cudmore P Pretty Weltman
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
## 2020-09-01 NOTE — Progress Notes (Signed)
Inpatient Rehab Admissions Coordinator Note:   Per PT request, pt was screened for CIR candidacy by Shann Medal, PT, DPT.  At this time we are recommending a CIR consult and I will request an order per our protocol.  Please contact me with questions.   Shann Medal, PT, DPT [phone] 09/01/20 3:24 PM
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
## 2020-09-01 NOTE — Progress Notes (Signed)
2 Days Post-Op  Subjective: 80 year old male status post debridement of right hand wound and right hand reconstruction with abdominal flap with Dr. Claudia Desanctis on 08/30/2020.  Patient is currently waiting for SNF placement.  He is overall doing well, he reports he had some bleeding from his wound yesterday.  He is eating breakfast this morning and has no complaints.  Objective: Vital signs in last 24 hours: Temp:  [97.8 F (36.6 C)-98.1 F (36.7 C)] 98.1 F (36.7 C) (05/11 0600) Pulse Rate:  [78-85] 84 (05/11 0600) Resp:  [17-18] 17 (05/11 0600) BP: (124-154)/(55-74) 153/74 (05/11 0600) SpO2:  [96 %-100 %] 97 % (05/11 0600)    Intake/Output from previous day: 05/10 0701 - 05/11 0700 In: 240 [P.O.:240] Out: 1200 [Urine:1200] Intake/Output this shift: No intake/output data recorded.  General appearance: alert, cooperative, no distress and Sitting up in bed eating breakfast Resp: Unlabored GI: Soft, nontender, colostomy in place over left abdomen. Extremities: extremities normal, atraumatic, no edema and SCDs in place Incision/Wound: Right hand sutured to abdominal wall flap.  Flap with good capillary refill.  Some swelling of fasciocutaneous flap noted, no purulence.  No erythema noted.  Normal temperature of flap noted.  Lab Results:  CBC Latest Ref Rng & Units 08/30/2020  WBC 4.0 - 10.5 K/uL 5.2  Hemoglobin 13.0 - 17.0 g/dL 10.4(L)  Hematocrit 39.0 - 52.0 % 31.4(L)  Platelets 150 - 400 K/uL 185    BMET Recent Labs    08/30/20 1032  NA 138  K 3.5  CL 107  CO2 26  GLUCOSE 100*  BUN 16  CREATININE 1.15  CALCIUM 8.6*   PT/INR No results for input(s): LABPROT, INR in the last 72 hours. ABG No results for input(s): PHART, HCO3 in the last 72 hours.  Invalid input(s): PCO2, PO2  Studies/Results: No results found.  Anti-infectives: Anti-infectives (From admission, onward)   Start     Dose/Rate Route Frequency Ordered Stop   08/30/20 1045  clindamycin (CLEOCIN) IVPB 900  mg        900 mg 100 mL/hr over 30 Minutes Intravenous On call to O.R. 08/30/20 1030 08/30/20 1420      Assessment/Plan: s/p Procedure(s): right hand reconstruction Abdominal flap/closure  Patient is overall doing well -flap appears stable.  Continue to keep abdominal binder in place.  No sign of infection, seroma, hematoma.  VTE prophylaxis with SCDs and Lovenox. Pain control with Norco, ibuprofen, Tylenol.  Morphine as needed.  Case management working on placement at [facility] for recovery after surgery due to patient limitations.  Appreciate their assistance.  No changes   LOS: 2 days    Charlies Constable, PA-C 09/01/2020

## 2020-09-01 NOTE — Plan of Care (Signed)
Patient constantly pulling at abdominal binder and trying to take it off. Patient finally settled down. Will continue to monitor patient.    Problem: Education: Goal: Knowledge of General Education information will improve Description: Including pain rating scale, medication(s)/side effects and non-pharmacologic comfort measures Outcome: Progressing   Problem: Activity: Goal: Risk for activity intolerance will decrease Outcome: Progressing   Problem: Pain Managment: Goal: General experience of comfort will improve Outcome: Progressing   Problem: Safety: Goal: Ability to remain free from injury will improve Outcome: Progressing   Problem: Skin Integrity: Goal: Risk for impaired skin integrity will decrease Outcome: Progressing
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
## 2020-09-02 NOTE — Progress Notes (Addendum)
OT Note  Measure and cut length of InterDry to fit in between RUE and trunk. Interdry should be placed above suture area of forearm up into axilla (use "seesaw" technique to avoid shoulder movement)to reduce MASD. An additional piece of Interdry should be placed between radial aspect of hand (thumb) and abdomen/upper thigh.  Allow for 2 inches of overhang from skin edges to allow for wicking to occur May remove to bathe; dry area thoroughly and then tuck into affected areas again DO NOT APPLY any creams or ointments when using InterDry DO NOT THROW AWAY FOR 5 DAYS unless soiled with stool DO NOT [facility] product, this will inactivate the silver in the material  New sheet of Interdry should be applied after 5 days of use if patient continues to have skin breakdown Kellie Simmering #[id number]   Thank you Maurie Boettcher, OT/L   Acute OT Clinical Specialist Acute Rehabilitation Services Pager [phone] Office [phone]
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
## 2020-09-02 NOTE — Progress Notes (Signed)
3 Days Post-Op  Subjective: 80 year old male status postop right hand debridement and right hand reconstruction with abdominal flap with Dr. Claudia Desanctis 3 days ago.  Patient is sitting in bed eating breakfast this AM.  Reports some pain to his hand.    Objective: Vital signs in last 24 hours: Temp:  [98 F (36.7 C)] 98 F (36.7 C) (05/11 1933) Pulse Rate:  [74-84] 74 (05/11 1933) Resp:  [16-17] 16 (05/11 1933) BP: (139-150)/(68-74) 139/74 (05/11 1933) SpO2:  [94 %-95 %] 94 % (05/11 1933)    Intake/Output from previous day: 05/11 0701 - 05/12 0700 In: 360 [P.O.:360] Out: 1800 [Urine:1800] Intake/Output this shift: No intake/output data recorded.  General appearance: alert, no distress and Sitting up in bed eating breakfast Extremities: extremities normal, atraumatic, no cyanosis or edema and SCDs in place Incision/Wound: Right hand suture to abdominal flap.  Good capillary refill noted.  Some erythema noted of abdominal incision, not cellulitic.  No purulent drainage.  Appears irritated.  No foul odor is noted.  No active bleeding noted.  Some oozing drainage noted on abdominal binder.  Right hand flap incisions intact.  No necrosis is noted.  There is some swelling of the flap.  Lab Results:  CBC Latest Ref Rng & Units 08/30/2020  WBC 4.0 - 10.5 K/uL 5.2  Hemoglobin 13.0 - 17.0 g/dL 10.4(L)  Hematocrit 39.0 - 52.0 % 31.4(L)  Platelets 150 - 400 K/uL 185    BMET Recent Labs    08/30/20 1032  NA 138  K 3.5  CL 107  CO2 26  GLUCOSE 100*  BUN 16  CREATININE 1.15  CALCIUM 8.6*   PT/INR No results for input(s): LABPROT, INR in the last 72 hours. ABG No results for input(s): PHART, HCO3 in the last 72 hours.  Invalid input(s): PCO2, PO2  Studies/Results: No results found.  Anti-infectives: Anti-infectives (From admission, onward)   Start     Dose/Rate Route Frequency Ordered Stop   08/30/20 1045  clindamycin (CLEOCIN) IVPB 900 mg        900 mg 100 mL/hr over 30 Minutes  Intravenous On call to O.R. 08/30/20 1030 08/30/20 1420      Assessment/Plan: s/p Procedure(s): right hand reconstruction Abdominal flap/closure  Patient is overall doing well.  Flap appears stable.  No signs of infection.  Waiting on SNF placement.  Appreciate PT and OT evaluations. VTE prophylaxis with SCDs and Lovenox. Pain control with Norco, ibuprofen, Tylenol as needed.  Morphine as needed.    LOS: 3 days    Charlies Constable, PA-C 09/02/2020

## 2020-09-02 NOTE — Plan of Care (Signed)

## 2020-09-02 NOTE — Progress Notes (Signed)
Inpatient Rehab Admissions Coordinator:   Attempted to touch base with pt to discuss CIR, but no answer on room phone.  Will continue to try to get in contact with him.   Shann Medal, PT, DPT Admissions Coordinator [phone] 09/02/20  2:54 PM
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
## 2020-09-03 MED ORDER — BISMUTH SUBSALICYLATE 262 MG/15ML PO SUSP
30.0000 mL | ORAL | Status: DC | PRN
  Administered 2020-09-03 – 2020-09-05 (×4): 30 mL via ORAL
  Filled 2020-09-03 (×2): qty 236

## 2020-09-03 MED ORDER — POLYETHYLENE GLYCOL 3350 17 G PO PACK
17.0000 g | PACK | Freq: Every day | ORAL | Status: DC
  Administered 2020-09-03 – 2020-09-12 (×9): 17 g via ORAL
  Filled 2020-09-03 (×10): qty 1

## 2020-09-03 MED ORDER — ENSURE ENLIVE PO LIQD
237.0000 mL | Freq: Two times a day (BID) | ORAL | Status: DC
  Administered 2020-09-03 – 2020-09-13 (×18): 237 mL via ORAL
  Filled 2020-09-03: qty 237

## 2020-09-03 NOTE — TOC Initial Note (Addendum)
Transition of Care [facility]) - Initial/Assessment Note    Patient Details  Name: Tony Fleming MRN: [id number] Date of Birth: November 27, 1940  [facility] CM/SW Contact:    Sharin Mons, RN Phone Number: 09/03/2020, 3:00 PM  Clinical Narrative:                 - S/p debridement of right hand wound and hand reconstruction with abdominal flap, 08/30/2020 RNCM received consult for possible SNF placement at time of discharge. RNCM spoke with patient regarding recommendation  for SNF placement at time of discharge. Patient reported that he lives alone and  is currently unable to care for self independently at home given his current physical needs and fall risk. Patient expressed understanding of his need for SNF and is agreeable to SNF placement at time of discharge. Patient reports preference for  [facility]  In [place]. RNCM discussed insurance authorization process and provided Medicare SNF ratings list. Patient expressed being hopeful for rehab and to feel better soon. No further questions reported at this time. RNCM to continue to follow and assist with discharge planning needs. SNF referral faxed out to [facility] via [place].  Fully COVID vaccinated / booster x2  Expected Discharge Plan: Skilled Nursing Facility Barriers to Discharge: Continued Medical Work up,No SNF bed   Patient Goals and CMS Choice   CMS Medicare.gov Compare Post Acute Care list provided to:: Patient    Expected Discharge Plan and Services Expected Discharge Plan: [place]       Living arrangements for the past 2 months: Single Family Home                                      Prior Living Arrangements/Services Living arrangements for the past 2 months: Single Family Home                     Activities of Daily Living Home Assistive Devices/Equipment: Eyeglasses,Blood pressure cuff,Walker (specify type) ADL Screening (condition  at time of admission) Patient's cognitive ability adequate to safely complete daily activities?: Yes Is the patient deaf or have difficulty hearing?: Yes Does the patient have difficulty seeing, even when wearing glasses/contacts?: Yes Does the patient have difficulty concentrating, remembering, or making decisions?: No Patient able to express need for assistance with ADLs?: Yes Does the patient have difficulty dressing or bathing?: Yes Independently performs ADLs?: No  Permission Sought/Granted                  Emotional Assessment              Admission diagnosis:  Open wound of right hand without foreign body, unspecified wound type, initial encounter [S61.401A] Patient Active Problem List   Diagnosis Date Noted  . Open wound of right hand without foreign body, unspecified wound type, initial encounter 08/30/2020   PCP:  Donalynn Furlong, MD Pharmacy:   [id number] - [place], [place] [address] Phone: [phone] Fax: [phone]     Social Determinants of Health (SDOH) Interventions    Readmission Risk Interventions No flowsheet data found.
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
## 2020-09-03 NOTE — Progress Notes (Signed)
4 Days Post-Op  Subjective: Resting in bed this morning eating breakfast.  RNs at bedside. He reports some tenderness to his left axilla from abdominal binder/breast binder.  No other complaints.  No fevers or chills. Objective: Vital signs in last 24 hours: Temp:  [97.8 F (36.6 C)-98.3 F (36.8 C)] 97.8 F (36.6 C) (05/13 0446) Pulse Rate:  [76-93] 76 (05/13 0446) Resp:  [15-17] 15 (05/13 0446) BP: (144-164)/(76-81) 164/76 (05/13 0446) SpO2:  [94 %-99 %] 96 % (05/13 0446)    Intake/Output from previous day: 05/12 0701 - 05/13 0700 In: 240 [P.O.:240] Out: 700 [Urine:700] Intake/Output this shift: No intake/output data recorded.  General appearance: alert, no distress and Resting in bed Chest wall: no tenderness GI: soft, non-tender; bowel sounds normal; no masses,  no organomegaly and Colostomy bag in place -left abdomen.  No swelling noted. Extremities: extremities normal, atraumatic, no cyanosis or edema and SCDs in place Pulses: 2+ radial pulse Incision/Wound: Right hand sutured to right abdominal wall flap.  Some irritation and erythema of the abdominal incision.  No dehiscence noted.  No purulence noted.  No cellulitic changes noted.  There is some swelling of the abdominal flap graft but good capillary refill noted.  Bruising noted on patient's dressing.  No active bleeding noted.  Swelling of digits noted.  2+ radial pulse  Lab Results:  CBC Latest Ref Rng & Units 08/30/2020  WBC 4.0 - 10.5 K/uL 5.2  Hemoglobin 13.0 - 17.0 g/dL 10.4(L)  Hematocrit 39.0 - 52.0 % 31.4(L)  Platelets 150 - 400 K/uL 185    BMET No results for input(s): NA, K, CL, CO2, GLUCOSE, BUN, CREATININE, CALCIUM in the last 72 hours. PT/INR No results for input(s): LABPROT, INR in the last 72 hours. ABG No results for input(s): PHART, HCO3 in the last 72 hours.  Invalid input(s): PCO2, PO2  Studies/Results: No results found.  Anti-infectives: Anti-infectives (From admission, onward)    Start     Dose/Rate Route Frequency Ordered Stop   08/30/20 1045  clindamycin (CLEOCIN) IVPB 900 mg        900 mg 100 mL/hr over 30 Minutes Intravenous On call to O.R. 08/30/20 1030 08/30/20 1420      Assessment/Plan: s/p Procedure(s): right hand reconstruction Abdominal flap/closure  Recommend continuing to wear abdominal binder and chest binder to prevent patient from moving right arm and tearing graft or disrupting blood supply to graft.  Everything appears stable.  We are still waiting on SNF placement or admission to inpatient rehab.    Continue with pain control as needed. DVT prophylaxis- SCDs and Lovenox FEN -heart healthy diet   LOS: 4 days    Charlies Constable, PA-C 09/03/2020

## 2020-09-03 NOTE — Plan of Care (Signed)
  Problem: Education: Goal: Knowledge of General Education information will improve Description: Including pain rating scale, medication(s)/side effects and non-pharmacologic comfort measures Outcome: Progressing   Problem: Health Behavior/Discharge Planning: Goal: Ability to manage health-related needs will improve Outcome: Progressing   Problem: Clinical Measurements: Goal: Ability to maintain clinical measurements within normal limits will improve Outcome: Progressing   Problem: Pain Managment: Goal: General experience of comfort will improve Outcome: Progressing   Problem: Activity: Goal: Risk for activity intolerance will decrease Outcome: Not Progressing   Problem: Nutrition: Goal: Adequate nutrition will be maintained Outcome: Not Progressing   Problem: Skin Integrity: Goal: Risk for impaired skin integrity will decrease Outcome: Not Progressing

## 2020-09-03 NOTE — Progress Notes (Addendum)
4 Days Post-Op  Subjective: 80 year old male status post right hand debridement and right hand reconstruction with abdominal flap.  Saw patient this a.m. he was doing well.  Placed photo in EMR at that time.  Patient was working with PT and was repositioning in the bed after therapy and the distal portion of his abdominal flap to the dorsal hand had sheared/pulled per PT, RN and patient reports.  Patient reports she is feeling fine  Objective: Vital signs in last 24 hours: Temp:  [97.7 F (36.5 C)-98.3 F (36.8 C)] 97.7 F (36.5 C) (05/13 1500) Pulse Rate:  [76-109] 109 (05/13 1500) Resp:  [15-18] 18 (05/13 1500) BP: (140-164)/(76-88) 163/87 (05/13 1500) SpO2:  [94 %-98 %] 97 % (05/13 1500)    Intake/Output from previous day: 05/12 0701 - 05/13 0700 In: 240 [P.O.:240] Out: 700 [Urine:700] Intake/Output this shift: Total I/O In: 120 [P.O.:120] Out: 300 [Urine:300]  General appearance: alert, cooperative, no distress and Sitting up in bed, resting Incision/Wound: Right hand abdominal flap with dehiscence along the most distal aspect and medial aspect.  Lateral and proximal sutures remain intact.  No purulence is noted.  There is some residual swelling from his initial surgery.  Lab Results:  CBC Latest Ref Rng & Units 08/30/2020  WBC 4.0 - 10.5 K/uL 5.2  Hemoglobin 13.0 - 17.0 g/dL 10.4(L)  Hematocrit 39.0 - 52.0 % 31.4(L)  Platelets 150 - 400 K/uL 185    BMET No results for input(s): NA, K, CL, CO2, GLUCOSE, BUN, CREATININE, CALCIUM in the last 72 hours. PT/INR No results for input(s): LABPROT, INR in the last 72 hours. ABG No results for input(s): PHART, HCO3 in the last 72 hours.  Invalid input(s): PCO2, PO2  Studies/Results: No results found.  Anti-infectives: Anti-infectives (From admission, onward)   Start     Dose/Rate Route Frequency Ordered Stop   08/30/20 1045  clindamycin (CLEOCIN) IVPB 900 mg        900 mg 100 mL/hr over 30 Minutes Intravenous On call to  O.R. 08/30/20 1030 08/30/20 1420      Assessment/Plan: s/p Procedure(s): right hand reconstruction Abdominal flap/closure  I evaluated patient's right hand.  The distal portion and medial portion had sheared. The area was prepped and draped in a sterile fashion.  3-0 PDS sutures were then used to reapproximate the incisional dehiscence edges at the distal and medial portion of the abdominal flap to the dorsal hand.  Multiple horizontal mattress sutures were placed.  Patient tolerated this fine.  The area was cleaned and a clean dressing was applied and the abdominal binder was reapplied.  Appreciate nursing and PT assistance.  Please call with questions or concerns.   Appreciate case management assistance with SNF placement.  Patient is stable for discharge from plastic surgery stance with appropriate placement in SNF.    LOS: 4 days    Charlies Constable, PA-C 09/03/2020
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
## 2020-09-03 NOTE — NC FL2 (Signed)
[place] MEDICAID FL2 LEVEL OF CARE SCREENING TOOL     IDENTIFICATION  Patient Name: Tony Fleming Birthdate: Mar 26, 1941 Sex: male Admission Date (Current Location): 08/30/2020  [facility] and [place] Number:  Herbalist and Address:  [facility] [place], [place] [address] [place], [place] [address]      Provider Number: [id number]  Attending Physician Name and Address:  [name], MD  Relative Name and Phone Number:  Warren Lacy, sister, [phone]    Current Level of Care: Hospital Recommended Level of Care: [place] Prior Approval Number:    Date Approved/Denied:   PASRR Number:    Discharge Plan: SNF    Current Diagnoses: Patient Active Problem List   Diagnosis Date Noted  . Open wound of right hand without foreign body, unspecified wound type, initial encounter 08/30/2020    Orientation RESPIRATION BLADDER Height & Weight     Self,Time,Situation,Place  Normal Continent Weight: 72.5 kg Height:  5\' 11"  (180.3 cm)  BEHAVIORAL SYMPTOMS/MOOD NEUROLOGICAL BOWEL NUTRITION STATUS      Colostomy Diet (Please see DC Summary)  AMBULATORY STATUS COMMUNICATION OF NEEDS Skin   Extensive Assist Verbally Surgical wounds (s/p right hand reconstruction (Right Hand)  Abdominal flap/closure, 5/9)                       Personal Care Assistance Level of Assistance  Bathing,Feeding,Dressing Bathing Assistance: Maximum assistance Feeding assistance: Maximum assistance Dressing Assistance: Maximum assistance     Functional Limitations Info  Sight,Hearing Sight Info: Adequate Hearing Info: Impaired (HOH, wears hearing aids)      SPECIAL CARE FACTORS FREQUENCY  PT (By licensed PT),OT (By licensed OT)     PT Frequency: 5x/week OT Frequency: 5x/week            Contractures Contractures Info: Not present    Additional Factors Info  Code Status,Allergies Code Status Info: Full Allergies Info: Penicillins            Current Medications (09/03/2020):  This is the current hospital active medication list Current Facility-Administered Medications  Medication Dose Route Frequency Provider Last Rate Last Admin  . acetaminophen (TYLENOL) tablet 325 mg  325 mg Oral Q6H Scheeler, Carola Rhine, PA-C   325 mg at 09/03/20 1237  . bismuth subsalicylate (PEPTO BISMOL) 262 MG/15ML suspension 30 mL  30 mL Oral Q4H PRN Scheeler, Carola Rhine, PA-C   30 mL at 09/03/20 1349  . enoxaparin (LOVENOX) injection 40 mg  40 mg Subcutaneous Q24H Scheeler, Carola Rhine, PA-C   40 mg at 09/03/20 1238  . HYDROcodone-acetaminophen (NORCO/VICODIN) 5-325 MG per tablet 1-2 tablet  1-2 tablet Oral Q4H PRN Scheeler, Carola Rhine, PA-C   2 tablet at 09/03/20 1026  . ibuprofen (ADVIL) tablet 600 mg  600 mg Oral Q6H PRN Scheeler, Carola Rhine, PA-C      . lisinopril (ZESTRIL) tablet 10 mg  10 mg Oral Daily Scheeler, Carola Rhine, PA-C   10 mg at 09/03/20 0919  . meclizine (ANTIVERT) tablet 12.5 mg  12.5 mg Oral TID PRN Scheeler, Carola Rhine, PA-C      . morphine 2 MG/ML injection 1 mg  1 mg Intravenous Q3H PRN Scheeler, Carola Rhine, PA-C      . ondansetron (ZOFRAN-ODT) disintegrating tablet 4 mg  4 mg Oral Q6H PRN Scheeler, Carola Rhine, PA-C       Or  . ondansetron (ZOFRAN) injection 4 mg  4 mg Intravenous Q6H PRN Scheeler, Carola Rhine, PA-C      .  polyethylene glycol (MIRALAX / GLYCOLAX) packet 17 g  17 g Oral Daily PRN Scheeler, Carola Rhine, PA-C         Discharge Medications: Please see discharge summary for a list of discharge medications.  Relevant Imaging Results:  Relevant Lab Results:   Additional Information    Sharin Mons, RN

## 2020-09-03 NOTE — Progress Notes (Signed)
Some of patients sutures popped when PT/OT were transferring patient back to the bed. Pt moved his hand away from body and made sutures come apart. Abdominal binder was still around patient when transferring with a stedy. I left Mingo Amber, MD a voicemail. Also tried Social worker office to get in touch with someone. No number in note or pager to page on [place].

## 2020-09-03 NOTE — Progress Notes (Signed)
Inpatient Rehab Admissions Coordinator:   Pt very hard of hearing, but therapy in room when I called and they relayed my request to speak to his sister.  He agreed.  I did speak to his sister, [name], to explain goals/expectations of CIR.  We talked about estimated length of stay to be 10-14 days with goals of supervision.  Unfortunately, [name] is the only support patient has and she is not able to provide 24/7 supervision for him 2/2 her own health issues.  We talked about pt's PLOF and [name] reports he was struggling with mobility and self care prior to this hospitalization.  She feels that pt needs more help than she can provide, even long term.  I relayed this to the Roetta Sessions, PA-C, and Whitman Hero, RN CM.  CIR will sign off at this time.   Of note: [name] did have questions regarding whether or not pt could proceed with further treatment on his nose prior to discharge as it is hard from them to travel.  I will defer that to his medical team.    Shann Medal, PT, DPT Admissions Coordinator [phone] 09/03/20  11:04 AM

## 2020-09-04 NOTE — Plan of Care (Signed)

## 2020-09-04 NOTE — Progress Notes (Signed)
PT Cancellation Note  Patient Details Name: Tony Fleming MRN: [id number] DOB: 1941-03-15   Cancelled Treatment:    Reason Eval/Treat Not Completed: Active bedrest order.   Zenaida Niece 09/04/2020, 10:36 AM
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
## 2020-09-04 NOTE — Progress Notes (Signed)
Patient is now postop day 5 from a groin flap for right dorsal hand defect.  He is overall doing okay.  He did pull loose portion of the flap distally yesterday on transfers to be resutured at.  He feels overall at his baseline.  On exam the flap is healthy and viable and in position.  The donor site skin was a little bit irritated and red but there is no purulence or drainage and is more than likely reaction to the Steri-Strips which have been removed.  We will plan to keep him on bedrest for now as it seems like he is at risk for a avulsing the flap with transfers.  We are still working on placement.

## 2020-09-04 NOTE — Plan of Care (Signed)
  Problem: Education: Goal: Knowledge of General Education information will improve Description: Including pain rating scale, medication(s)/side effects and non-pharmacologic comfort measures Outcome: Progressing   Problem: Health Behavior/Discharge Planning: Goal: Ability to manage health-related needs will improve Outcome: Progressing   Problem: Clinical Measurements: Goal: Ability to maintain clinical measurements within normal limits will improve Outcome: Progressing   Problem: Pain Managment: Goal: General experience of comfort will improve Outcome: Progressing   Problem: Safety: Goal: Ability to remain free from injury will improve Outcome: Progressing   Problem: Activity: Goal: Risk for activity intolerance will decrease Outcome: Not Progressing   Problem: Nutrition: Goal: Adequate nutrition will be maintained Outcome: Not Progressing   Problem: Skin Integrity: Goal: Risk for impaired skin integrity will decrease Outcome: Not Progressing

## 2020-09-05 NOTE — Plan of Care (Signed)
  Problem: Education: Goal: Knowledge of General Education information will improve Description: Including pain rating scale, medication(s)/side effects and non-pharmacologic comfort measures Outcome: Progressing   Problem: Clinical Measurements: Goal: Ability to maintain clinical measurements within normal limits will improve Outcome: Progressing   Problem: Pain Managment: Goal: General experience of comfort will improve Outcome: Progressing   Problem: Health Behavior/Discharge Planning: Goal: Ability to manage health-related needs will improve Outcome: Not Progressing   Problem: Activity: Goal: Risk for activity intolerance will decrease Outcome: Not Progressing   Problem: Nutrition: Goal: Adequate nutrition will be maintained Outcome: Not Progressing   Problem: Skin Integrity: Goal: Risk for impaired skin integrity will decrease Outcome: Not Progressing

## 2020-09-05 NOTE — Progress Notes (Signed)
Patient seen an examined. No changes clinically. Flap is still well perfused and well attached to the recipient site. Planning to keep patient bedrest as transfers put him at risk for avulsing the flap. Still awaiting placement.

## 2020-09-05 NOTE — Plan of Care (Signed)

## 2020-09-06 ENCOUNTER — Inpatient Hospital Stay (HOSPITAL_COMMUNITY): Payer: Medicare Other

## 2020-09-06 LAB — COMPREHENSIVE METABOLIC PANEL
ALT: 18 U/L (ref 0–44)
AST: 20 U/L (ref 15–41)
Albumin: 2.8 g/dL — ABNORMAL LOW (ref 3.5–5.0)
Alkaline Phosphatase: 78 U/L (ref 38–126)
Anion gap: 11 (ref 5–15)
BUN: 49 mg/dL — ABNORMAL HIGH (ref 8–23)
CO2: 25 mmol/L (ref 22–32)
Calcium: 8.7 mg/dL — ABNORMAL LOW (ref 8.9–10.3)
Chloride: 103 mmol/L (ref 98–111)
Creatinine, Ser: 1.52 mg/dL — ABNORMAL HIGH (ref 0.61–1.24)
GFR, Estimated: 46 mL/min — ABNORMAL LOW (ref >60)
Glucose, Bld: 153 mg/dL — ABNORMAL HIGH (ref 70–99)
Potassium: 3.5 mmol/L (ref 3.5–5.1)
Sodium: 139 mmol/L (ref 135–145)
Total Bilirubin: 0.5 mg/dL (ref 0.3–1.2)
Total Protein: 6 g/dL — ABNORMAL LOW (ref 6.5–8.1)

## 2020-09-06 LAB — CBC
HCT: 34.1 % — ABNORMAL LOW (ref 39.0–52.0)
Hemoglobin: 11.4 g/dL — ABNORMAL LOW (ref 13.0–17.0)
MCH: 30.9 pg (ref 26.0–34.0)
MCHC: 33.4 g/dL (ref 30.0–36.0)
MCV: 92.4 fL (ref 80.0–100.0)
Platelets: 261 10*3/uL (ref 150–400)
RBC: 3.69 MIL/uL — ABNORMAL LOW (ref 4.22–5.81)
RDW: 13.4 % (ref 11.5–15.5)
WBC: 9.7 10*3/uL (ref 4.0–10.5)
nRBC: 0 % (ref 0.0–0.2)

## 2020-09-06 LAB — RESP PANEL BY RT-PCR (FLU A&B, COVID) ARPGX2
Influenza A by PCR: NEGATIVE
Influenza B by PCR: NEGATIVE
SARS Coronavirus 2 by RT PCR: NEGATIVE

## 2020-09-06 MED ORDER — MAGNESIUM HYDROXIDE 400 MG/5ML PO SUSP
15.0000 mL | Freq: Every day | ORAL | Status: DC
  Administered 2020-09-06 – 2020-09-12 (×6): 15 mL via ORAL
  Filled 2020-09-06 (×8): qty 30

## 2020-09-06 MED ORDER — SODIUM CHLORIDE 0.9 % IV BOLUS
500.0000 mL | Freq: Once | INTRAVENOUS | Status: AC
  Administered 2020-09-06: 500 mL via INTRAVENOUS

## 2020-09-06 MED ORDER — LACTATED RINGERS IV SOLN: INTRAVENOUS | Status: DC

## 2020-09-06 NOTE — NC FL2 (Signed)
[place] MEDICAID FL2 LEVEL OF CARE SCREENING TOOL     IDENTIFICATION  Patient Name: Tony Fleming Birthdate: 11-Jul-1940 Sex: male Admission Date (Current Location): 08/30/2020  [facility] and [place] Number:  Herbalist and Address:  [facility], [place] [address] [place], [place] [address]      Provider Number: [id number]  Attending Physician Name and Address:  [name], MD  Relative Name and Phone Number:  Warren Lacy, sister, [phone]    Current Level of Care: Hospital Recommended Level of Care: [place] Prior Approval Number:    Date Approved/Denied:   PASRR Number:    Discharge Plan: SNF    Current Diagnoses: Patient Active Problem List   Diagnosis Date Noted  . Open wound of right hand without foreign body, unspecified wound type, initial encounter 08/30/2020    Orientation RESPIRATION BLADDER Height & Weight     Self,Time,Situation,Place  Normal Continent Weight: 72.5 kg Height:  5\' 11"  (180.3 cm)  BEHAVIORAL SYMPTOMS/MOOD NEUROLOGICAL BOWEL NUTRITION STATUS      Colostomy Diet (Please see DC Summary)  AMBULATORY STATUS COMMUNICATION OF NEEDS Skin   Extensive Assist Verbally Surgical wounds (s/p right hand reconstruction (Right Hand)  Abdominal flap/closure, 5/9)                       Personal Care Assistance Level of Assistance  Bathing,Feeding,Dressing Bathing Assistance: Maximum assistance Feeding assistance: Maximum assistance Dressing Assistance: Maximum assistance     Functional Limitations Info  Sight,Hearing Sight Info: Adequate Hearing Info: Impaired (HOH, wears hearing aids)      SPECIAL CARE FACTORS FREQUENCY  PT (By licensed PT),OT (By licensed OT)     PT Frequency: 5x/week OT Frequency: 5x/week            Contractures Contractures Info: Not present    Additional Factors Info  Code Status,Allergies Code Status Info: Full Allergies Info: Penicillins            Current Medications (09/06/2020):  This is the current hospital active medication list Current Facility-Administered Medications  Medication Dose Route Frequency Provider Last Rate Last Admin  . acetaminophen (TYLENOL) tablet 325 mg  325 mg Oral Q6H Scheeler, Carola Rhine, PA-C   325 mg at 09/06/20 6962  . bismuth subsalicylate (PEPTO BISMOL) 262 MG/15ML suspension 30 mL  30 mL Oral Q4H PRN Scheeler, Carola Rhine, PA-C   30 mL at 09/05/20 1729  . enoxaparin (LOVENOX) injection 40 mg  40 mg Subcutaneous Q24H Scheeler, Carola Rhine, PA-C   40 mg at 09/05/20 1151  . feeding supplement (ENSURE ENLIVE / ENSURE PLUS) liquid 237 mL  237 mL Oral BID Scheeler, Carola Rhine, PA-C   237 mL at 09/06/20 1027  . HYDROcodone-acetaminophen (NORCO/VICODIN) 5-325 MG per tablet 1-2 tablet  1-2 tablet Oral Q4H PRN Scheeler, Carola Rhine, PA-C   1 tablet at 09/05/20 2158  . ibuprofen (ADVIL) tablet 600 mg  600 mg Oral Q6H PRN Scheeler, Carola Rhine, PA-C   600 mg at 09/06/20 9528  . lisinopril (ZESTRIL) tablet 10 mg  10 mg Oral Daily Scheeler, Carola Rhine, PA-C   10 mg at 09/06/20 1023  . meclizine (ANTIVERT) tablet 12.5 mg  12.5 mg Oral TID PRN Scheeler, Carola Rhine, PA-C      . morphine 2 MG/ML injection 1 mg  1 mg Intravenous Q3H PRN Scheeler, Carola Rhine, PA-C      . ondansetron (ZOFRAN-ODT) disintegrating tablet 4 mg  4 mg  Oral Q6H PRN Scheeler, Carola Rhine, PA-C       Or  . ondansetron (ZOFRAN) injection 4 mg  4 mg Intravenous Q6H PRN Scheeler, Carola Rhine, PA-C      . polyethylene glycol (MIRALAX / GLYCOLAX) packet 17 g  17 g Oral Daily PRN Scheeler, Carola Rhine, PA-C      . polyethylene glycol (MIRALAX / GLYCOLAX) packet 17 g  17 g Oral Daily Scheeler, Carola Rhine, PA-C   17 g at 09/06/20 1023     Discharge Medications: Please see discharge summary for a list of discharge medications.  Relevant Imaging Results:  Relevant Lab Results:   Additional Information ss# [id number]  Sharin Mons, RN

## 2020-09-06 NOTE — Progress Notes (Signed)
7 Days Post-Op  Subjective: Patient is 1 week postop from right hand reconstruction. Patient reported to have a small amount of emesis earlier today.  Patient does not complain of abdominal pain.  He does report that he has not had a BM since surgery.  He otherwise has no complaints.   Objective: Vital signs in last 24 hours: Temp:  [97.6 F (36.4 C)-98.6 F (37 C)] 98.6 F (37 C) (05/16 0849) Pulse Rate:  [86-88] 88 (05/16 0849) Resp:  [16-18] 18 (05/16 0849) BP: (142-188)/(74-94) 142/86 (05/16 0849) SpO2:  [94 %-96 %] 96 % (05/16 0849)    Intake/Output from previous day: 05/15 0701 - 05/16 0700 In: 120 [P.O.:120] Out: 0  Intake/Output this shift: No intake/output data recorded.  General appearance: alert, cooperative, no distress and Resting in bed Nose: Skin cancer of nose noted, ulcerative GI: Abdominal distention noted with palpation.  No pain noted.  Colostomy bag in place with no stool output.  Incision along right lateral abdomen noted.  No cellulitic changes noted. Incision/Wound: Right hand reconstruction, flap is viable.  No dehiscence noted.  No erythema or cellulitic changes are noted.  No purulence noted.  Some swelling of hand is noted.  Swelling of flap is noted as expected.  No necrosis is noted.  Lab Results:  CBC Latest Ref Rng & Units 08/30/2020  WBC 4.0 - 10.5 K/uL 5.2  Hemoglobin 13.0 - 17.0 g/dL 10.4(L)  Hematocrit 39.0 - 52.0 % 31.4(L)  Platelets 150 - 400 K/uL 185    BMET No results for input(s): NA, K, CL, CO2, GLUCOSE, BUN, CREATININE, CALCIUM in the last 72 hours. PT/INR No results for input(s): LABPROT, INR in the last 72 hours. ABG No results for input(s): PHART, HCO3 in the last 72 hours.  Invalid input(s): PCO2, PO2  Studies/Results: No results found.  Anti-infectives: Anti-infectives (From admission, onward)   Start     Dose/Rate Route Frequency Ordered Stop   08/30/20 1045  clindamycin (CLEOCIN) IVPB 900 mg        900 mg 100  mL/hr over 30 Minutes Intravenous On call to O.R. 08/30/20 1030 08/30/20 1420      Assessment/Plan: s/p Procedure(s): right hand reconstruction Abdominal flap/closure  Patient is stable from right hand reconstruction.  Flap has good perfusion and incisions are intact.  Patient with no stool output for 1 week.  KUB ordered for further evaluation.  Patient with one episode of small amount of emesis.  Encourage hydration.  CMP/CBC ordered.    LOS: 7 days    Charlies Constable, PA-C 09/06/2020

## 2020-09-06 NOTE — Progress Notes (Signed)
Orthopedic Tech Progress Note Patient Details:  Ruddy Swire [birth year] [phone] RN called requesting a NEW ABDOMINAL BINDER, one patient has on now is soiled    Ortho Devices Type of Ortho Device: Abdominal binder Ortho Device/Splint Location: STOMACH Ortho Device/Splint Interventions: Other (comment)   Post Interventions Patient Tolerated: Well Instructions Provided: Care of [place] 09/06/2020, 12:50 PM

## 2020-09-06 NOTE — Progress Notes (Signed)
Patient seen an examined. No complaints.  Now 1 week postop.  No changes clinically. Flap is still well perfused and well attached to the recipient site. Planning to keep patient bedrest as transfers put him at risk for avulsing the flap. Still awaiting placement.

## 2020-09-06 NOTE — Care Management Important Message (Signed)
Important Message  Patient Details  Name: Tony Fleming MRN: [id number] Date of Birth: Aug 03, 1940   Medicare Important Message Given:  Yes     [place] 09/06/2020, 2:22 PM

## 2020-09-06 NOTE — TOC Progression Note (Signed)
Transition of Care [facility]) - Progression Note    Patient Details  Name: Tony Fleming MRN: [id number] Date of Birth: July 17, 1940  Transition of Care [facility]) CM/SW Contact  Sharin Mons, RN Phone Number: 09/06/2020, 10:46 AM  Clinical Narrative:     [facility] accepted pt for SNF placement. Pt made aware by nurse. Pt happy regarding placement. [facility] admission to f/u with NCM today with bed availability today or tomorrow. NCM to made MD /nurse aware.  TOC team will continue to monitor and assist with TOC needs....   Expected Discharge Plan: Skilled Nursing Facility Barriers to Discharge: Continued Medical Work up,No SNF bed  Expected Discharge Plan and Services Expected Discharge Plan: [place] arrangements for the past 2 months: Single Family Home                                       Social Determinants of Health (SDOH) Interventions    Readmission Risk Interventions No flowsheet data found.
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
## 2020-09-06 NOTE — Consult Note (Addendum)
[place] Nurse ostomy follow up Patient receiving care in [facility] 5N05. Stoma type/location: WOC consulted for colostomy. 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  The patient has been in hospital for 7 days, has the same pouch on that he arrived with, and has had no stool output or flatus during this admission.  Also, his primary RN reports he vomited today.  I am concerned he may have a bowel blockage.  I have reached out to Dr. Mingo Amber, via office staff Joelene Millin, to relay my concerns. She has sent Dr. Silverio Lay my personal cell number in order for him to reach me. Stomal assessment/size: as best I can tell through the existing pouch, the stomal is approximately 7/8 - 1 inch in diameter, and flat. Peristomal assessment: deferred, supplies to be obtained from material management Treatment options for stomal/peristomal skin: na Output: none in 7 days. The existing pouch the patient was admitted with is in pristine condition, not even any prior fecal remnants in the pouch. Ostomy pouching: Patient wears a one piece flat, but I have requested a 2 piece 2 3/4 inches, flat. [place] #[phone] and [phone]. Val Riles, RN, MSN, CWOCN, CNS-BC, pager [phone]

## 2020-09-07 ENCOUNTER — Ambulatory Visit: Payer: Medicare Other

## 2020-09-07 ENCOUNTER — Telehealth (INDEPENDENT_AMBULATORY_CARE_PROVIDER_SITE_OTHER): Payer: Medicare Other

## 2020-09-07 ENCOUNTER — Ambulatory Visit: Payer: Medicare Other | Admitting: Radiation Oncology

## 2020-09-07 DIAGNOSIS — S61401A Unspecified open wound of right hand, initial encounter: Secondary | ICD-10-CM

## 2020-09-07 LAB — CREATININE, SERUM
Creatinine, Ser: 1.32 mg/dL — ABNORMAL HIGH (ref 0.61–1.24)
GFR, Estimated: 54 mL/min — ABNORMAL LOW (ref >60)

## 2020-09-07 LAB — GLUCOSE, CAPILLARY: Glucose-Capillary: 110 mg/dL — ABNORMAL HIGH (ref 70–99)

## 2020-09-07 NOTE — Progress Notes (Signed)
Pt still stating his stomach is hurting and is not keeping foods down. He has tried to drink 2 ensures today and has vomited both times after drinking. Still no output from the colostomy.
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
## 2020-09-07 NOTE — Progress Notes (Signed)
Orthopedic Tech Progress Note Patient Details:  Tony Fleming July 15, 1940 [id number] RN called requesting 2 new ABDOMINAL BINDERS. Ortho Devices Type of Ortho Device: Abdominal binder Ortho Device/Splint Location: STOMACH Ortho Device/Splint Interventions: Ordered   Post Interventions Patient Tolerated: Well Instructions Provided: [facility] of [place] 09/07/2020, 6:56 PM

## 2020-09-07 NOTE — Plan of Care (Signed)
  Problem: Education: Goal: Knowledge of General Education information will improve Description: Including pain rating scale, medication(s)/side effects and non-pharmacologic comfort measures Outcome: Progressing   Problem: Elimination: Goal: Will not experience complications related to urinary retention Outcome: Progressing   Problem: Pain Managment: Goal: General experience of comfort will improve Outcome: Progressing   Problem: Health Behavior/Discharge Planning: Goal: Ability to manage health-related needs will improve Outcome: Not Progressing   Problem: Activity: Goal: Risk for activity intolerance will decrease Outcome: Not Progressing   Problem: Nutrition: Goal: Adequate nutrition will be maintained Outcome: Not Progressing   Problem: Elimination: Goal: Will not experience complications related to bowel motility Outcome: Not Progressing   Problem: Skin Integrity: Goal: Risk for impaired skin integrity will decrease Outcome: Not Progressing

## 2020-09-07 NOTE — TOC Progression Note (Signed)
Transition of Care [facility]) - Progression Note    Patient Details  Name: Kemo Spruce MRN: [id number] Date of Birth: [birth year]  [facility] CM/SW Contact  Sharin Mons, RN Phone Number: 09/07/2020, 11:05 AM  Clinical Narrative:    NCM received call from [facility] [employer]. Amber informed NCM SNF available when pt medically ready. NCM made Amber aware pt not medically ready today, will f/u with Amber on tomorrow. TOC team monitoring and will continue to assist with needs....   Expected Discharge Plan: [place] Barriers to Discharge: Continued Medical Work up  Expected Discharge Plan and Services Expected Discharge Plan: [place] arrangements for the past 2 months: Single Family Home                                       Social Determinants of Health (SDOH) Interventions    Readmission Risk Interventions No flowsheet data found.

## 2020-09-07 NOTE — Telephone Encounter (Signed)
In-person visit to pt in room # 5/5North:  Pt iis awake & alert- he states that he is doing well-he reports minimal pain right hand.  The grafted skin on his right hand appears intact & sutures are intact to his lower abdomen area. He demonstrated slight ROM fingers on right hand & has full ROM of his left hand/arm. He states that he is learning to use his non-dominant left hand more to assist with daily activities.  He voiced an understanding of the need to keep that right  Hand secured with the abdominal binder to avoid compromising the graft. He remains on bedrest- & he states he will be scheduled to go to SNF soon. Patient indicated that he has not had bowel activity as of yet- this was confirmed by Gari Crown, RN. He continues Miralax/MOM regimen. 5 am lab results today = creatinine is 1.32- v/s yesterday= 1.52 GFR= 54/ yesterday = 46 Brittney also states his BP is up today @ 184/72- Brittney informed me that he will go to [facility] upon d/c according to his Case Mgr.- Whitman Hero (ph#) [phone]. I informed RN that I will forward the above info to Dr. Claudia Desanctis for review

## 2020-09-08 ENCOUNTER — Inpatient Hospital Stay (HOSPITAL_COMMUNITY): Payer: Medicare Other

## 2020-09-08 LAB — BASIC METABOLIC PANEL
Anion gap: 12 (ref 5–15)
BUN: 47 mg/dL — ABNORMAL HIGH (ref 8–23)
CO2: 25 mmol/L (ref 22–32)
Calcium: 8.9 mg/dL (ref 8.9–10.3)
Chloride: 105 mmol/L (ref 98–111)
Creatinine, Ser: 1.33 mg/dL — ABNORMAL HIGH (ref 0.61–1.24)
GFR, Estimated: 54 mL/min — ABNORMAL LOW (ref >60)
Glucose, Bld: 125 mg/dL — ABNORMAL HIGH (ref 70–99)
Potassium: 3.6 mmol/L (ref 3.5–5.1)
Sodium: 142 mmol/L (ref 135–145)

## 2020-09-08 MED ORDER — MOMETASONE FURO-FORMOTEROL FUM 200-5 MCG/ACT IN AERO
2.0000 | INHALATION_SPRAY | Freq: Two times a day (BID) | RESPIRATORY_TRACT | Status: DC
  Administered 2020-09-08 – 2020-09-13 (×10): 2 via RESPIRATORY_TRACT
  Filled 2020-09-08: qty 8.8

## 2020-09-08 MED ORDER — LACTATED RINGERS IV BOLUS
500.0000 mL | Freq: Once | INTRAVENOUS | Status: AC
  Administered 2020-09-08: 500 mL via INTRAVENOUS

## 2020-09-08 NOTE — Progress Notes (Signed)
9 Days Post-Op  Subjective: 80 year old male status post right hand debridement with abdominal fasciocutaneous flap to right hand.  Patient has had some constipation postoperatively, bowel regimen has been increased to milk of magnesia and MiraLAX daily.  This a.m. after abdominal x-ray patient had fair amount of stool output into colostomy.  Patient reports he is overall feeling well.  Denies any abdominal pain.  He does report that he would like to use his inhaler.  Objective: Vital signs in last 24 hours: Temp:  [97.6 F (36.4 C)-97.8 F (36.6 C)] 97.8 F (36.6 C) (05/18 0238) Pulse Rate:  [90-100] 100 (05/18 0238) Resp:  [16-17] 17 (05/18 0238) BP: (168-178)/(80-86) 168/86 (05/18 0238) SpO2:  [89 %-94 %] 89 % (05/18 0238)    Intake/Output from previous day: 05/17 0701 - 05/18 0700 In: 1200 [P.O.:120; I.V.:1080] Out: 400 [Urine:400] Intake/Output this shift: No intake/output data recorded.  General appearance: alert, cooperative, no distress and Resting in bed Head: Normocephalic, without obvious abnormality, atraumatic, Skin cancer ulcerative lesion noted on right nose and lesion noted on nasal tip Resp: Unlabored GI: Soft, nontender, stool output noted in colostomy.  Mild distention of the abdomen noted Extremities: extremities normal, atraumatic, no cyanosis or edema Incision/Wound: Right hand reconstruction with flap in place, flap is viable and well perfused.  Good capillary refill. No cellulitic changes.  No purulent drainage noted.  No foul odor is noted.  Lab Results:  CBC Latest Ref Rng & Units 09/06/2020 08/30/2020  WBC 4.0 - 10.5 K/uL 9.7 5.2  Hemoglobin 13.0 - 17.0 g/dL 11.4(L) 10.4(L)  Hematocrit 39.0 - 52.0 % 34.1(L) 31.4(L)  Platelets 150 - 400 K/uL 261 185    BMET Recent Labs    09/06/20 1516 09/07/20 0511 09/08/20 0200  NA 139  --  142  K 3.5  --  3.6  CL 103  --  105  CO2 25  --  25  GLUCOSE 153*  --  125*  BUN 49*  --  47*  CREATININE 1.52* 1.32*  1.33*  CALCIUM 8.7*  --  8.9   PT/INR No results for input(s): LABPROT, INR in the last 72 hours. ABG No results for input(s): PHART, HCO3 in the last 72 hours.  Invalid input(s): PCO2, PO2  Studies/Results: DG Abd 1 View  Result Date: 09/08/2020 CLINICAL DATA:  Nausea EXAM: ABDOMEN - 1 VIEW COMPARISON:  09/06/2020 FINDINGS: There is mild gaseous distension of the stomach. Multiple gas-filled prominent loops of small bowel are again seen throughout the abdomen. There is diminishing gas and stool seen throughout the colon. Together, the findings suggest changes of a distal partial or developing small bowel obstruction. No gross free intraperitoneal gas. Moderate stool seen within the distal colon. Surgical clips are seen within the pelvis and surgical skin staples noted overlying the right iliac crest. IMPRESSION: Progressive gaseous distension of the small bowel diffusely and evacuation of gas and stool within the colon suggesting changes of a distal partial or developing small bowel obstruction. Electronically Signed   By: Fidela Salisbury MD   On: 09/08/2020 06:48   DG Abd 1 View  Result Date: 09/06/2020 CLINICAL DATA:  Abdomen enlarged. EXAM: ABDOMEN - 1 VIEW COMPARISON:  None. FINDINGS: Multiple dilated air-filled small bowel loops are seen throughout the abdomen. A moderate amount of stool is seen throughout the large bowel. No radio-opaque calculi or other significant radiographic abnormality are seen. Radiopaque surgical clips are seen overlying the pelvis. IMPRESSION: Findings consistent with a small bowel obstruction versus  ileus. Electronically Signed   By: Virgina Norfolk M.D.   On: 09/06/2020 16:59    Anti-infectives: Anti-infectives (From admission, onward)   Start     Dose/Rate Route Frequency Ordered Stop   08/30/20 1045  clindamycin (CLEOCIN) IVPB 900 mg        900 mg 100 mL/hr over 30 Minutes Intravenous On call to O.R. 08/30/20 1030 08/30/20 1420       Assessment/Plan: s/p Procedure(s): right hand reconstruction Abdominal flap/closure  Continue with MiraLAX and milk of magnesia bowel regimen.  Patient is having stool output, will hold off on NG tube placement. X-ray today was prior to patient having stool output.  Mild gaseous distention.  Creatinine 1.33 today, essentially unchanged from yesterday.  Continue with IV fluids for hydration  Pain is well controlled. Flap in place, viable, healing nicely.  Plan for possible discharge to SNF tomorrow pending continued improvement in stool output   LOS: 9 days    Charlies Constable, PA-C 09/08/2020
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
## 2020-09-08 NOTE — Plan of Care (Signed)
  Problem: Education: Goal: Knowledge of General Education information will improve Description: Including pain rating scale, medication(s)/side effects and non-pharmacologic comfort measures Outcome: Progressing   Problem: Health Behavior/Discharge Planning: Goal: Ability to manage health-related needs will improve Outcome: Progressing   Problem: Clinical Measurements: Goal: Ability to maintain clinical measurements within normal limits will improve Outcome: Progressing   Problem: Nutrition: Goal: Adequate nutrition will be maintained Outcome: Progressing   Problem: Elimination: Goal: Will not experience complications related to bowel motility Outcome: Progressing   Problem: Pain Managment: Goal: General experience of comfort will improve Outcome: Progressing   Problem: Activity: Goal: Risk for activity intolerance will decrease Outcome: Not Progressing

## 2020-09-09 ENCOUNTER — Other Ambulatory Visit: Payer: Self-pay | Admitting: Surgical

## 2020-09-09 MED ORDER — POLYETHYLENE GLYCOL 3350 17 G PO PACK: 17.0000 g | PACK | Freq: Every day | ORAL | 0 refills | Status: DC

## 2020-09-09 MED ORDER — ENOXAPARIN SODIUM 40 MG/0.4ML IJ SOSY: 40.0000 mg | PREFILLED_SYRINGE | INTRAMUSCULAR | 0 refills | Status: DC

## 2020-09-09 MED ORDER — MAGNESIUM HYDROXIDE 400 MG/5ML PO SUSP: 15.0000 mL | Freq: Every day | ORAL | 0 refills | Status: DC

## 2020-09-09 NOTE — Plan of Care (Signed)

## 2020-09-09 NOTE — Care Management Important Message (Signed)
Important Message  Patient Details  Name: Tony Fleming MRN: [id number] Date of Birth: 1940/08/12   Medicare Important Message Given:  Yes     Merle Whitehorn P Nyra Anspaugh 09/09/2020, 2:30 PM

## 2020-09-09 NOTE — Discharge Instructions (Signed)
Patient to be discharged to skilled nursing facility.  He is to remain bedbound while at the skilled nursing facility and have abdominal binder on 05/1 to prevent complications with graft to right hand.  Recommend skilled nursing facility change dressings on right thigh and abdomen with Mepilex border dressings every 3 days being careful not to put too much pressure on the graft during dressing changes.  Recommend barrier dressing between arm and abdomen to help prevent increased moisture/irritation or wounds.  Recommend patient continue with Lovenox 40 mg injections every 24 hours while at the skilled nursing facility for DVT prophylaxis given his bedbound status.  Recommend continuing MiraLAX and milk of magnesia while at the skilled nursing facility to prevent ongoing distention/ileus.  Recommend normal diet as tolerated.  Can drink protein shakes as desired for increased protein to optimize healing.  Patient will need follow-up with plastic surgery 2 weeks after discharge for reevaluation of graft.

## 2020-09-09 NOTE — Progress Notes (Signed)
Patient's sister, [name], called for updates on patient (patient gave verbal OK for updates to his sister). Family member updated about patient status and explained discharge plan, family member verbalized an understanding to updates and would like to be notified later when patient gets picked up for d/c.

## 2020-09-09 NOTE — Discharge Summary (Addendum)
Physician Discharge Summary  Patient ID: Tony Fleming MRN: [id number] DOB/AGE: 01/05/1941 80 y.o.  Admit date: 08/30/2020 Discharge date: 09/13/2020 Admission Diagnoses: Right hand defect  Discharge Diagnoses:  Active Problems:   Open wound of right hand without foreign body, unspecified wound type, initial encounter   Discharged Condition: good  Hospital Course: Patient is an 80 year old male who presented to the [facility] operating room on 08/30/2020 for right hand reconstruction for a large defect of his right hand after Mohs procedure.  He underwent reconstruction of the right dorsal hand defect with superficial circumflex iliac artery fasciocutaneous flap by Dr. Claudia Desanctis on 08/30/2020.  He has remained hospitalized for the past 10 days for postoperative management and assistance with placement at skilled nursing facility.  He developed some abdominal distention postoperatively which improved with MiraLAX and milk of magnesia.   Patient is doing well today.  He reports that he feels well and denies any chest pain, shortness of breath, nausea, vomiting, fevers, chills.  Overnight he had consistent output from his colostomy bag per RN.  Abdominal distention has improved.    Consults: Physical therapy, Occupational Therapy, wound ostomy nursing team  Significant Diagnostic Studies: Patient with x-ray noting mild gaseous distention of his stomach  Treatments: IV hydration, analgesia: acetaminophen, Vicodin and Morphine, therapies: PT and OT and surgery: Right hand reconstruction with superficial circumflex iliac artery fasciocutaneous flap  Discharge Exam: Blood pressure 139/68, pulse 88, temperature 98.4 F (36.9 C), temperature source Oral, resp. rate 18, height 5\' 11"  (1.803 m), weight 72.5 kg, SpO2 98 %. General appearance: alert, cooperative, no distress and Resting in bed Head: Normocephalic, without obvious abnormality, atraumatic Nose: Ulcerative lesion noted on right nasal  bridge Resp: Unlabored, symmetric rise and fall Chest wall: no tenderness GI: Soft, nontender, minimal distention, colostomy bag in place over left lower quadrant with mild amount of stool output noted. Extremities: extremities normal, atraumatic, no cyanosis or edema Pulses: 2+ and symmetric Incision/Wound: Right hand flap in place, viable and good capillary refill.  Good range of motion of all 5 digits noted.  No cellulitic changes or erythema noted.  Mepilex border dressings in place between right medial wrist and abdominal incision.  No purulence noted.  Disposition: Discharge disposition: 03-Skilled Nursing Facility       Discharge Instructions    Call MD for:  difficulty breathing, headache or visual disturbances   Complete by: As directed    Call MD for:  extreme fatigue   Complete by: As directed    Call MD for:  hives   Complete by: As directed    Call MD for:  persistant dizziness or light-headedness   Complete by: As directed    Call MD for:  persistant nausea and vomiting   Complete by: As directed    Call MD for:  redness, tenderness, or signs of infection (pain, swelling, redness, odor or green/yellow discharge around incision site)   Complete by: As directed    Call MD for:  severe uncontrolled pain   Complete by: As directed    Call MD for:  temperature >100.4   Complete by: As directed    Diet - low sodium heart healthy   Complete by: As directed    Discharge instructions   Complete by: As directed    Patient to be discharged to skilled nursing facility.  He is to remain bedbound while at the skilled nursing facility and have abdominal binder on 62/2 to prevent complications with graft.  Recommend skilled nursing  facility change dressings on right thigh and abdomen with Mepilex border dressings every 3 days.  Recommend barrier dressing between arm and abdomen to help prevent increased moisture/additional wounds.  Patient will need follow-up with plastic surgery  2 weeks after discharge for reevaluation of graft.   Increase activity slowly   Complete by: As directed      Allergies as of 09/09/2020      Reactions   Penicillins Rash   Tolerated ceftriaxone at OSH      Medication List    TAKE these medications   calcium carbonate 1250 (500 Ca) MG tablet Commonly known as: OS-CAL - dosed in mg of elemental calcium Take 1 tablet by mouth daily with breakfast.   enoxaparin 40 MG/0.4ML injection Commonly known as: LOVENOX Inject 0.4 mLs (40 mg total) into the skin daily for 21 days.   lisinopril 10 MG tablet Commonly known as: ZESTRIL Take 10 mg by mouth in the morning and at bedtime.   magnesium hydroxide 400 MG/5ML suspension Commonly known as: MILK OF MAGNESIA Take 15 mLs by mouth daily.   meclizine 12.5 MG tablet Commonly known as: ANTIVERT Take 12.5 mg by mouth 3 (three) times daily as needed for dizziness.   multivitamin with minerals tablet Take 1 tablet by mouth daily.   polyethylene glycol 17 g packet Commonly known as: MIRALAX / GLYCOLAX Take 17 g by mouth daily.   traMADol 50 MG tablet Commonly known as: ULTRAM Take 1 tablet by mouth every 6 (six) hours as needed for severe pain.   vitamin B-12 500 MCG tablet Commonly known as: CYANOCOBALAMIN Take 500 mcg by mouth daily.   Vitamin D (Ergocalciferol) 1.25 MG (50000 UNIT) Caps capsule Commonly known as: DRISDOL Take 50,000 Units by mouth every Wednesday.   vitamin E 180 MG (400 UNITS) capsule Take 400 Units by mouth daily.   Wixela Inhub 250-50 MCG/ACT Aepb Generic drug: fluticasone-salmeterol Inhale 1 puff into the lungs 2 (two) times daily.       Follow-up Information    [name], MD. Schedule an appointment as soon as possible for a visit in 2 week(s).   Specialty: Plastic Surgery Why: Patient will require 2-week follow-up with Dr. Claudia Desanctis.  Office staff have been notified as well Contact information: [place]  [address] [phone]               [facility] [address] [place], [place] [address] [phone]   Patient is stable for discharge from plastic surgery stance. Recommend continuing with Lovenox injections every 24 hours while at SNF (40 mg daily) Recommend remaining bedbound while at the SNF. Recommend dressing changes to right abdomen and thigh every 3 days with Mepilex border dressings to protect skin from excessive moisture and irritation.  Abdominal binder to remain intact 24/7 unless bathing or adjusting. Recommend continuing with MiraLAX and milk of magnesia to prevent constipation/ileus. Recommend following up in plastic surgery office with Dr. Claudia Desanctis in 2 weeks for reevaluation of his right hand flap. Recommend pain control with Tylenol as needed. Normal diet    Signed: Carola Rhine Shatina Streets 09/09/2020, 9:39 AM

## 2020-09-09 NOTE — TOC Progression Note (Addendum)
Transition of Care [facility]) - Progression Note    Patient Details  Name: Tony Fleming MRN: [id number] Date of Birth: 25-Jan-1941  [facility] of [facility]) CM/SW Contact  Sharin Mons, RN Phone Number: 09/09/2020, 11:03 AM  Clinical Narrative:    NCM spoke with admission nurse Amber @ [facility] regarding d/c plan for today. Amber informed NCM in order to receive pt   pt's future surgical date is needed, and  also who will transport pt from [facility] to hospital for future surgery. States they don't make transportation arrangements to medical appointments and  insurance will not covers it.   Pt with 82 y/o sister who has  medical issues will not be able to assist with transporting pt on surgical day. NCM spoke with Dr. Keane Scrape nurse Marguarite Arbour [phone]). Marguarite Arbour is to make MD aware and f/u with NCM. NCM to make pt /sister aware. TOCteam will continue to monitor and assist with needs....  09/19/2020 @ 1313 NCM received call from [facility]'s admission director Merrilyn Puma [phone]) to discuss pt's SNF transition. NCM shared information given by admission coordinator Amber regarding information needed in order to receive pt( future surgical date and who would transport pt to hospital to have surgery on surgery day). NCM explained if pt transition to [facility] per MD's nurse Shauna surgery will be scheduled for 09/29/2020@ 3:30 pm [facility]), and pt's sister will make sure transportation arranged to get pt to hospital. Merrilyn Puma told NCM that they will accept pt and it's ok to arrange transportation with [facility]. NCM made pt, pt's sister, bedside nurse and MD's nurse Shauna aware [facility] called and pt pick arranged.  09/08/2020 @1348  NCM received call from [facility]/ Amber with DON/ Shirlean Mylar on speaker phone. Shirlean Mylar stated they needed more information before receiving pt. Stated MD 's d/c summary states for pt to remain in bed and needed a consult note on specifics with managing abd binder... we  haven't had a case like this before. NCM informed them she will have MD or MD's nurse to call them to explain and answer questions surrounding pt's surgery. DON/Robin stated she can be reached on Amber's [phone]). NCM called MD's nurse shared concern and ask if they would call [facility]' s DON/Robin. MD's nurse Marguarite Arbour said she will give them a call.  09/09/2020 @ 1354 [facility] called and  cancelled.  09/08/2020 @ 1630  MD's nurse/Shauna stated has made multi calls to [facility], voice messages left . ... still awaiting call back  09/08/2020 @ 1700 NCM made pt and sister aware no response from [place]. NCM given the ok to fax out SNF referral to [facility] [phone]) in [place].  TOC team will continue to monitor and assist with needs  Expected Discharge Plan: [place] Barriers to Discharge: Continued Medical Work up  Expected Discharge Plan and Services Expected Discharge Plan: [place] arrangements for the past 2 months: Single Family Home Expected Discharge Date: 09/09/20                                     Social Determinants of Health (SDOH) Interventions    Readmission Risk Interventions No flowsheet data found.
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
## 2020-09-10 NOTE — TOC Progression Note (Addendum)
Transition of Care [facility]) - Progression Note    Patient Details  Name: Taite Schoeppner MRN: [id number] Date of Birth: 04/12/1941  [facility] CM/SW Contact  Sharin Mons, RN Phone Number: 09/10/2020, 8:34 AM  Clinical Narrative:    Pt with noted SNF bed acceptance from [facility]. NCM spoke with admission liaison/ Helene Kelp  regarding potential bed offering. Helene Kelp and nurse from facility coming to hospital to see pt to determine if they can manage and extend SNF Bed this am. Bedsisde nurse to make pt aware. NCM made pt's Sister, [name] aware and voice message left with Dr. Keane Scrape nurse Marguarite Arbour. NCM will continue to monitor and assist with TOC needs....     Expected Discharge Plan: [place] Barriers to Discharge: No SNF bed,Continued Medical Work up  Expected Discharge Plan and Services Expected Discharge Plan: [place] arrangements for the past 2 months: Single Family Home Expected Discharge Date: 09/09/20                                     Social Determinants of Health (SDOH) Interventions    Readmission Risk Interventions No flowsheet data found.

## 2020-09-10 NOTE — Progress Notes (Signed)
PT Cancellation Note  Patient Details Name: Brydan Downard MRN: [id number] DOB: 1940-10-15   Cancelled Treatment:    Reason Eval/Treat Not Completed: Other (comment). Pt is currently working with OT. PT to re-attempt as time allows.   Lorriane Shire 09/10/2020, 12:36 PM   Lorrin Goodell, PT  Office # [phone] Pager [phone]

## 2020-09-10 NOTE — TOC Initial Note (Addendum)
Transition of Care [facility]) - Initial/Assessment Note    Patient Details  Name: Tony Fleming MRN: [id number] Date of Birth: 10/18/1940  Transition of Care [facility]) CM/SW Contact:    Sharin Mons, RN Phone Number: 09/10/2020, 3:35 PM  Clinical Narrative:                 [facility] admissions / Helene Kelp and [facility] nurse @ bedside to assess if they can manage pt's care . After bedside face to face with pt  [facility] admission extended SNF bed offer. Pt  accepted. Per admission liaison/Teresa bed will be available on Monday, MD , nurse and sister made aware.  TOC team will continue to monitor and assist with needs....  Expected Discharge Plan: Skilled Nursing Facility Barriers to Discharge: No SNF bed,Continued Medical Work up   Patient Goals and CMS Choice   CMS Medicare.gov Compare Post Acute Care list provided to:: Patient    Expected Discharge Plan and Services Expected Discharge Plan: [place]       Living arrangements for the past 2 months: Single Family Home Expected Discharge Date: 09/09/20                                    Prior Living Arrangements/Services Living arrangements for the past 2 months: Single Family Home                     Activities of Daily Living Home Assistive Devices/Equipment: Eyeglasses,Blood pressure cuff,Walker (specify type) ADL Screening (condition at time of admission) Patient's cognitive ability adequate to safely complete daily activities?: Yes Is the patient deaf or have difficulty hearing?: Yes Does the patient have difficulty seeing, even when wearing glasses/contacts?: No Does the patient have difficulty concentrating, remembering, or making decisions?: Yes Patient able to express need for assistance with ADLs?: Yes Does the patient have difficulty dressing or bathing?: Yes Independently performs ADLs?: No Communication: Independent Does the patient have difficulty walking or climbing stairs?:  Yes Weakness of Legs: None Weakness of Arms/Hands: Right  Permission Sought/Granted                  Emotional Assessment              Admission diagnosis:  Open wound of right hand without foreign body, unspecified wound type, initial encounter [S61.401A] Patient Active Problem List   Diagnosis Date Noted  . Open wound of right hand without foreign body, unspecified wound type, initial encounter 08/30/2020   PCP:  Donalynn Furlong, MD Pharmacy:   [id number] - [place], [place] [address] Phone: [phone] Fax: [phone]     Social Determinants of Health (SDOH) Interventions    Readmission Risk Interventions No flowsheet data found.

## 2020-09-11 ENCOUNTER — Telehealth: Payer: Self-pay
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
## 2020-09-11 NOTE — Progress Notes (Signed)
Patient seen in his hospital room. Overall stable condition and feeling well. Flap is viable and looks healthy. He has a [place] over a centimeter or two in the same position as before. Don't think there's much utility and selling this back as he's now pulled it for the second time. It looks like there's enough left to perfuse the flap so should be fine as long as no further traction injuries occur. He's still awaiting placement which  should happen Monday. All of his questions were answered.

## 2020-09-11 NOTE — Telephone Encounter (Signed)
In patient f/u visit [place]/Rm# 5: Pt is currently waiting to be discharged to SNF.per Gregary Signs, [place] Nurse Case Mgr. a local SNF facility is scheduled to perform assessment/evaluation of patient's status/diagnosis & nursing needs to provide determination regarding admission. Gregary Signs, RN is present during f/u. She reports the following: Bowel function/colostomy is WNL- with the Miralax/MOM regimen. He is taking fluids/food " protein/supplement drink" Labs: Creatinine =1.33 CBG= 125 Na= 132 K= 3.6 On assessment- his right hand/graft site is mostly intact- except for approximately 2.5 cm radial aspect is no longer attached- no sutures are present at this site.  The remainder of the incision/sutures are intact. The binder is in place- but per Gregary Signs it was placed higher on the abdomen d/t the lower position was compromising the colostomy site. She verified that PT/OT team continues to avoid any movement/shearing of the hand graft area with therapy sessions.  We removed/replace all the moisture/wicking barrier dressings- between the hand/abdomen & it appears that the peri-wound skin is intact. I instructed/demonstrated to Gregary Signs to use "rolled towel/cloth"  In the palm of right hand to assist in maintaining a neutral position for the hand while at rest.  We also folded a towel under right wrist/ hand to keep arm/hand at same level as abdomen. We added a 4x4 gauze pad over the open area of the graft site.  Gregary Signs & myself inspected his sacral area which has skin changes- there are 2 red areas- without open wounds at this time- the staff is currently using  skin barrier/protectant.  Gregary Signs will consult with the wound care team for recommendation for air mattress/pad for pressure point protection.  Case manager will assist in ordering the air mattress & colostomy supplies to forward to SNF. I will inform Dr, Claudia Desanctis that he may need to add this to the D/C orders. I explained to the pt & to Gregary Signs that Dr. Claudia Desanctis  plans to perform the reconstruction/repair surgery in ~ 1-2 weeks after d/c to SNF. Per RN depending on when the assessment/acceptance from the SNF is completed- the pt may not be d/c until Mon 09/13/20

## 2020-09-11 NOTE — Plan of Care (Signed)

## 2020-09-11 NOTE — Telephone Encounter (Signed)
Per Gregary Signs, RN:  Colostomy supplies- will be ordered by the wound/care team or case mgr as follows: Convatec ([employer]) "one-piece"  ( 3/4" - 2.5"  /  19 - 84mm) Ref# [id number]

## 2020-09-12 DIAGNOSIS — S61401A Unspecified open wound of right hand, initial encounter: Secondary | ICD-10-CM

## 2020-09-12 NOTE — Patient Instructions (Signed)
Pt is scheduled to be d/c to [place] [facility] on Monday 5/32/22 he will return to [facility] main for the next surgical procedure by Dr. Claudia Desanctis to release/reconstruct the right hand skin flap in next 1-2 weeks

## 2020-09-12 NOTE — Progress Notes (Signed)
09/12/20- in pt f/u visit 5n/RM#5:  PT is sleeping comfortably, but did arouse when I inspected his right hand/groin graft site. He reports that he is having very  minimal pain., and the incision remains closed except for the small opening near the thumb area.  The staff continues to keep this opening covered with 4x4 gauze.  His hand is propped up with rolled towels & his hand remains in alignment with the abomen/groin area to avoid any further separation of the graft.  abdominal binder is in place. There are no new labs available at this time.he continues to have output from colosotomy.  He informrd me that he will be d/c tomorrow to [place].  This was confirmed by Rodena Piety, RN- who has worked with the Nurse Case Mgr to assist with obtaining the necessary supplies at the SNF (ie) special air mattress & colostomy supplies.  They did accept this transfer. They will have the necesssry supplies ready when he arrives. Pt is having a productive cough- but per the RN, this remains clear & intermittent. Vitals sign are WNL He continues to have urine output from the condom catheter. He continues take small amounts of food/protein supplemental drinks for added nutritional status. I assured the patient that Dr. Claudia Desanctis will schedule the repair surgery ASAP.

## 2020-09-12 NOTE — Plan of Care (Signed)

## 2020-09-13 ENCOUNTER — Inpatient Hospital Stay (HOSPITAL_COMMUNITY)
Admission: RE | Admit: 2020-09-13 | Discharge: 2020-09-15 | Disposition: A | Discharge status: Skilled Nursing Facility | Payer: Medicare Other | Attending: Plastic Surgery | Admitting: Plastic Surgery

## 2020-09-13 DIAGNOSIS — Z9841 Cataract extraction status, right eye: Secondary | ICD-10-CM

## 2020-09-13 DIAGNOSIS — D649 Anemia, unspecified: Secondary | ICD-10-CM | POA: Diagnosis present

## 2020-09-13 DIAGNOSIS — Z79899 Other long term (current) drug therapy: Secondary | ICD-10-CM

## 2020-09-13 DIAGNOSIS — K56609 Unspecified intestinal obstruction, unspecified as to partial versus complete obstruction: Secondary | ICD-10-CM | POA: Diagnosis present

## 2020-09-13 DIAGNOSIS — K9189 Other postprocedural complications and disorders of digestive system: Secondary | ICD-10-CM | POA: Diagnosis present

## 2020-09-13 DIAGNOSIS — Z85828 Personal history of other malignant neoplasm of skin: Secondary | ICD-10-CM

## 2020-09-13 DIAGNOSIS — I10 Essential (primary) hypertension: Secondary | ICD-10-CM | POA: Diagnosis present

## 2020-09-13 DIAGNOSIS — Y92239 Unspecified place in hospital as the place of occurrence of the external cause: Secondary | ICD-10-CM | POA: Diagnosis present

## 2020-09-13 DIAGNOSIS — Y834 Other reconstructive surgery as the cause of abnormal reaction of the patient, or of later complication, without mention of misadventure at the time of the procedure: Secondary | ICD-10-CM | POA: Diagnosis present

## 2020-09-13 DIAGNOSIS — T8189XA Other complications of procedures, not elsewhere classified, initial encounter: Principal | ICD-10-CM | POA: Diagnosis present

## 2020-09-13 DIAGNOSIS — Z880 Allergy status to penicillin: Secondary | ICD-10-CM

## 2020-09-13 DIAGNOSIS — Z9842 Cataract extraction status, left eye: Secondary | ICD-10-CM

## 2020-09-13 DIAGNOSIS — J45909 Unspecified asthma, uncomplicated: Secondary | ICD-10-CM | POA: Diagnosis present

## 2020-09-13 DIAGNOSIS — K567 Ileus, unspecified: Secondary | ICD-10-CM | POA: Diagnosis present

## 2020-09-13 DIAGNOSIS — Z87828 Personal history of other (healed) physical injury and trauma: Secondary | ICD-10-CM

## 2020-09-13 LAB — CREATININE, SERUM
Creatinine, Ser: 1.26 mg/dL — ABNORMAL HIGH (ref 0.61–1.24)
GFR, Estimated: 57 mL/min — ABNORMAL LOW (ref >60)

## 2020-09-13 MED ORDER — ENOXAPARIN SODIUM 40 MG/0.4ML IJ SOSY
40.0000 mg | PREFILLED_SYRINGE | INTRAMUSCULAR | Status: DC
  Administered 2020-09-13 – 2020-09-14 (×2): 40 mg via SUBCUTANEOUS
  Filled 2020-09-13 (×2): qty 0.4

## 2020-09-13 MED ORDER — ONDANSETRON HCL 4 MG/2ML IJ SOLN: 4.0000 mg | Freq: Four times a day (QID) | INTRAMUSCULAR | Status: DC | PRN

## 2020-09-13 MED ORDER — ACETAMINOPHEN 325 MG PO TABS
650.0000 mg | ORAL_TABLET | Freq: Four times a day (QID) | ORAL | Status: DC | PRN
  Administered 2020-09-13 – 2020-09-14 (×3): 650 mg via ORAL
  Filled 2020-09-13 (×3): qty 2

## 2020-09-13 MED ORDER — MOMETASONE FURO-FORMOTEROL FUM 200-5 MCG/ACT IN AERO
2.0000 | INHALATION_SPRAY | Freq: Two times a day (BID) | RESPIRATORY_TRACT | Status: DC
  Administered 2020-09-13 – 2020-09-15 (×4): 2 via RESPIRATORY_TRACT
  Filled 2020-09-13: qty 8.8

## 2020-09-13 MED ORDER — HYDROCODONE-ACETAMINOPHEN 5-325 MG PO TABS
1.0000 | ORAL_TABLET | ORAL | Status: DC | PRN
Start: 2020-09-13 — End: 2020-09-15

## 2020-09-13 MED ORDER — POLYETHYLENE GLYCOL 3350 17 G PO PACK: 17.0000 g | PACK | Freq: Every day | ORAL | Status: DC | PRN

## 2020-09-13 MED ORDER — ACETAMINOPHEN 650 MG RE SUPP: 650.0000 mg | Freq: Four times a day (QID) | RECTAL | Status: DC | PRN

## 2020-09-13 MED ORDER — MAGNESIUM HYDROXIDE 400 MG/5ML PO SUSP
15.0000 mL | Freq: Every day | ORAL | Status: DC
  Administered 2020-09-13 – 2020-09-15 (×2): 15 mL via ORAL
  Filled 2020-09-13 (×3): qty 30

## 2020-09-13 MED ORDER — ONDANSETRON 4 MG PO TBDP: 4.0000 mg | ORAL_TABLET | Freq: Four times a day (QID) | ORAL | Status: DC | PRN

## 2020-09-13 MED ORDER — PROCHLORPERAZINE EDISYLATE 10 MG/2ML IJ SOLN: 5.0000 mg | Freq: Four times a day (QID) | INTRAMUSCULAR | Status: DC | PRN

## 2020-09-13 MED ORDER — MECLIZINE HCL 12.5 MG PO TABS
12.5000 mg | ORAL_TABLET | Freq: Three times a day (TID) | ORAL | Status: DC | PRN
  Filled 2020-09-13: qty 1

## 2020-09-13 MED ORDER — LACTATED RINGERS IV SOLN: INTRAVENOUS | Status: DC

## 2020-09-13 MED ORDER — PROCHLORPERAZINE MALEATE 10 MG PO TABS
10.0000 mg | ORAL_TABLET | Freq: Four times a day (QID) | ORAL | Status: DC | PRN
  Filled 2020-09-13: qty 1

## 2020-09-13 MED ORDER — LISINOPRIL 10 MG PO TABS
10.0000 mg | ORAL_TABLET | Freq: Two times a day (BID) | ORAL | Status: DC
  Administered 2020-09-13 – 2020-09-15 (×4): 10 mg via ORAL
  Filled 2020-09-13 (×4): qty 1

## 2020-09-13 NOTE — Progress Notes (Signed)
I received a call from the nurse regarding an air mattress bed and wound care.  Bed and wound care ordered.  I saw the patient this morning and the flap appears to be viable.  He had some bleeding last night and it was controlled well with placing some Xeroform around the raw edges.  No additional intervention was needed.  The abdominal binder is in place.  Awaiting placement for discharge.
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
## 2020-09-13 NOTE — Progress Notes (Signed)
Pt report given to Vee,[facility] receiving RN.

## 2020-09-13 NOTE — TOC Transition Note (Addendum)
Transition of Care [facility]) - CM/SW Discharge Note   Patient Details  Name: Tony Fleming MRN: [id number] Date of Birth: 01/03/1941  [facility]) CM/SW Contact:  Sharin Mons, RN Phone Number: 09/13/2020, 10:37 AM   Clinical Narrative:    Patient will DC to: [facility] Anticipated DC date: 09/13/2020 Family notified: yes , sister Transport by: Corey Harold   Per MD patient ready for DC today. RN, patient, patient's family, and facility notified of DC. Discharge Summary and FL2 sent to facility. RN to call report prior to discharge ([phone]). Rm #[phone]. DC packet on chart. Ambulance transport requested for patient, time pick up 11:30 am. Follow up surgery appointment, 09/29/2020 @ 3:30 pm with Dr. March Rummage noted on AVS.  [name] Tye [place] (Sister)     [phone]       RNCM will sign off for now as intervention is no longer needed. Please consult [place] again if new needs arise.    Final next level of care: Skilled Nursing Facility Barriers to Discharge: No Barriers Identified   Patient Goals and CMS Choice   CMS Medicare.gov Compare Post Acute Care list provided to:: Patient    Discharge Placement                       Discharge Plan and Services                                     Social Determinants of Health (SDOH) Interventions     Readmission Risk Interventions No flowsheet data found.

## 2020-09-13 NOTE — Progress Notes (Signed)
[facility] transported pt to [place] [facility]

## 2020-09-13 NOTE — Progress Notes (Signed)
14 Days Post-Op  Subjective: Patient is 2 weeks postop from right hand reconstruction with right abdomen flap with Dr. Claudia Desanctis.  He was initially scheduled for discharge on 09/09/2020, however this was delayed due to transportation concerns for return to surgery in a few weeks.  He is now awaiting placement, potentially today.  He is sitting up in bed eating breakfast.  He reports he is overall doing well.  No complaints of pain.  Reports no issues breathing, denies shortness of breath.  Objective: Vital signs in last 24 hours: Temp:  [97.8 F (36.6 C)-99 F (37.2 C)] 98.3 F (36.8 C) (05/23 0528) Pulse Rate:  [83-90] 84 (05/23 0528) Resp:  [14-18] 14 (05/23 0528) BP: (155-174)/(71-80) 164/75 (05/23 0528) SpO2:  [92 %-96 %] 96 % (05/23 0528) Last BM Date: 09/13/20  Intake/Output from previous day: 05/22 0701 - 05/23 0700 In: -  Out: 50 [Stool:50] Intake/Output this shift: No intake/output data recorded.  General appearance: alert and Resting in bed Head: Normocephalic, without obvious abnormality, atraumatic, Lesions noted on nose GI: soft, non-tender; Distention significantly improved, output noted in colostomy bag Extremities: extremities normal, atraumatic, no cyanosis or edema Incision/Wound: Right hand flap in place.  He does have some dehiscence at the distal medial portion of the flap.  There is no cellulitic changes.  There is no purulence.  Xeroform and 4 x 4 gauze noted in place between medial wrist and abdomen incision.  Lab Results:  CBC Latest Ref Rng & Units 09/06/2020 08/30/2020  WBC 4.0 - 10.5 K/uL 9.7 5.2  Hemoglobin 13.0 - 17.0 g/dL 11.4(L) 10.4(L)  Hematocrit 39.0 - 52.0 % 34.1(L) 31.4(L)  Platelets 150 - 400 K/uL 261 185    BMET No results for input(s): NA, K, CL, CO2, GLUCOSE, BUN, CREATININE, CALCIUM in the last 72 hours. PT/INR No results for input(s): LABPROT, INR in the last 72 hours. ABG No results for input(s): PHART, HCO3 in the last 72  hours.  Invalid input(s): PCO2, PO2  Studies/Results: No results found.  Anti-infectives: Anti-infectives (From admission, onward)   Start     Dose/Rate Route Frequency Ordered Stop   08/30/20 1045  clindamycin (CLEOCIN) IVPB 900 mg        900 mg 100 mL/hr over 30 Minutes Intravenous On call to O.R. 08/30/20 1030 08/30/20 1420      Assessment/Plan: s/p Procedure(s): right hand reconstruction Abdominal flap/closure  Overall patient's right hand flap is doing well, he did have some additional dehiscence along the distal medial border.  DVT prophylaxis with Lovenox Recommend continuing with dressing changes as needed for drainage with Xeroform and 4 x 4 gauze. Abdominal binder to remain intact Continue use of MiraLAX and milk of magnesia for constipation Incentive spirometer use throughout the day  Possible discharge today to SNF for assistance with postop care.     LOS: 14 days    Charlies Constable, PA-C 09/13/2020

## 2020-09-13 NOTE — Care Management Important Message (Signed)
Important Message  Patient Details  Name: Tony Fleming MRN: [id number] Date of Birth: Jan 22, 1941   Medicare Important Message Given:  Yes     Barb Merino Angell Honse 09/13/2020, 2:18 PM

## 2020-09-13 NOTE — Progress Notes (Signed)
Called [facility] for pt report ; no one answering ;no voice mail option either.

## 2020-09-14 NOTE — Plan of Care (Signed)

## 2020-09-14 NOTE — H&P (Signed)
[facility]  Tony Fleming is an 80 y.o. male.  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  Patient then subsequently underwent right hand reconstruction with fasciocutaneous abdomen/groin flap on 08/30/2020 and was subsequently admitted to the hospital for inpatient management.  Patient subsequently developed postoperative ileus versus SBO on 09/06/2020.  This resolved with increased hydration, milk of magnesia and MiraLAX.  Patient was then subsequently medically stable and discharged to [facility].  Patient was transported to the skilled nursing facility and subsequently transported back to [facility] on 09/13/2020 after the skilled nursing facility nursing staff/wound care nurse rejected the patient and the SNF determined that they would not be able to care for the patient.  Patient with a past medical history of anemia, arthritis, asthma, hypertension and squamous cell carcinoma.  Patient reports today on exam that he is doing well, he reports that he is ready to go home.  He denies any fever, chills, nausea, vomiting, chest pain, shortness of breath.   Past Medical History:  Diagnosis Date  . Anemia   . Arthritis   . Asthma   . 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 Hypertension   . Urinary incontinence     Past Surgical History:  Procedure Laterality Date  . CATARACT EXTRACTION Bilateral   . COLONOSCOPY    . COLOSTOMY    . DEBRIDEMENT AND CLOSURE WOUND N/A 08/30/2020   Procedure: Abdominal flap/closure;  Surgeon: [name], MD;  Location: [place];  Service: Plastics;  Laterality: N/A;  . ESOPHAGOGASTRODUODENOSCOPY    . MUSCLE FLAP CLOSURE Right 08/30/2020   Procedure: right hand reconstruction;  Surgeon: [name], MD;  Location: [place];  Service:  Plastics;  Laterality: Right;  2 hours    No family history on file.  Social History:  reports that he has never smoked. He has never used smokeless tobacco. He reports that he does not drink alcohol and does not use drugs.  Allergies:  Allergies  Allergen Reactions  . Penicillins Rash    Tolerated ceftriaxone at OSH     No current facility-administered medications on file prior to encounter.   Current Outpatient Medications on File Prior to Encounter  Medication Sig Dispense Refill  . calcium carbonate (OS-CAL - DOSED IN MG OF ELEMENTAL CALCIUM) 1250 (500 Ca) MG tablet Take 1 tablet by mouth daily with breakfast.    . enoxaparin (LOVENOX) 40 MG/0.4ML injection Inject 0.4 mLs (40 mg total) into the skin daily for 21 days. 8.4 mL 0  . lisinopril (ZESTRIL) 10 MG tablet Take 10 mg by mouth in the morning and at bedtime.    . magnesium hydroxide (MILK OF MAGNESIA) 400 MG/5ML suspension Take 15 mLs by mouth daily. 355 mL 0  . meclizine (ANTIVERT) 12.5 MG tablet Take 12.5 mg by mouth 3 (three) times daily as needed for dizziness.    . Multiple Vitamins-Minerals (MULTIVITAMIN WITH MINERALS) tablet Take 1 tablet by mouth daily.    . polyethylene glycol (MIRALAX / GLYCOLAX) 17 g packet Take 17 g by mouth daily. 14 each 0  . traMADol (ULTRAM) 50 MG tablet Take 1 tablet by mouth every 6 (six) hours as needed for severe pain.    . vitamin B-12 (CYANOCOBALAMIN) 500 MCG tablet Take 500 mcg by mouth daily.    Marland Kitchen  Vitamin D, Ergocalciferol, (DRISDOL) 1.25 MG (50000 UNIT) CAPS capsule Take 50,000 Units by mouth every Wednesday.    . vitamin E 180 MG (400 UNITS) capsule Take 400 Units by mouth daily.    Grant Ruts INHUB 250-50 MCG/ACT AEPB Inhale 1 puff into the lungs 2 (two) times daily.       Results for orders placed or performed during the hospital encounter of 09/13/20 (from the past 48 hour(s))  Creatinine, serum     Status: Abnormal   Collection Time: 09/13/20  6:32 PM  Result Value Ref Range    Creatinine, Ser 1.26 (H) 0.61 - 1.24 mg/dL   GFR, Estimated 57 (L) >60 mL/min    Comment: (NOTE) Calculated using the CKD-EPI Creatinine Equation (2021) Performed at [facility], [place] [address], [place], [place] [address]     No results found.  Review of Systems  Constitutional: Negative for chills, diaphoresis, fever and malaise/fatigue.  Respiratory: Negative for cough, hemoptysis and shortness of breath.   Cardiovascular: Negative for chest pain and leg swelling.  Gastrointestinal: Negative for abdominal pain, diarrhea, nausea and vomiting.  Neurological: Negative for dizziness.   Blood pressure (!) 176/80, pulse 91, temperature 97.9 F (36.6 C), temperature source Oral, resp. rate 16, SpO2 97 %. Physical Exam Constitutional:      General: He is not in acute distress.    Appearance: He is obese. He is not ill-appearing, toxic-appearing or diaphoretic.     Comments: Elderly male   HENT:     Head: Normocephalic.     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  Cardiovascular:     Rate and Rhythm: Normal rate and regular rhythm.     Pulses: Normal pulses.  Pulmonary:     Effort: Pulmonary effort is normal.  Abdominal:     General: Abdomen is flat. There is no distension.     Palpations: Abdomen is soft.     Tenderness: There is no abdominal tenderness. There is no guarding.       Comments: Colostomy in place   Genitourinary:    Comments: Condom catheter in place Musculoskeletal:        General: No swelling.     Right lower leg: No edema.     Left lower leg: No edema.  Skin:    General: Skin is warm and dry.  Neurological:     General: No focal deficit present.     Mental Status: He is alert.  Psychiatric:        Mood and Affect: Mood normal.        Behavior: Behavior normal.     Assessment/Plan:  80 year old male status post right hand  reconstruction with abdominal flap with Dr. Claudia Desanctis on 08/30/2020.  Patient recently readmitted to the hospital after being denied by the skilled nursing facility after arrival at the skilled nursing facility.  - Right hand reconstruction: Patient currently on bedrest to prevent abdomen flap from dehiscing from right hand. Abdominal binder to remain intact to prevent dehiscence of graft.  - Ileus versus constipation: Improved/resolved.  Continue with MiraLAX and milk of magnesia.   -Hypertension: Continue with patient's home meds lisinopril 10 mg twice daily  DVT prophylaxis: SCD and lovenox Code Status: Full Disposition Plan: Remain hospitalized for 1-2 weeks, plan for surgery 09/21/20 for division and inset of right groin/hand flap Consults called: None  Admission status: inpatient   The patient is here from: SNF. Patient will be expected to be discharged to SNF or inpatient rehab after upcoming surgery scheduled for 09/21/2020.  Patient with barriers for discharge: Patient has been denied for multiple skilled nursing facilities due to the complexity of his right hand reconstruction/flap.    Carola Rhine Cheree Fowles, PA-C 09/14/2020, 2:27 PM

## 2020-09-14 NOTE — Progress Notes (Signed)
15 days postop Subjective: 80 year old male 15 days postop from right hand reconstruction with right abdomen flap with Dr. Claudia Desanctis.  Patient was discharged yesterday to a [facility] skilled nursing facility, however I was notified shortly after his departure that he was transported back to the hospital as the skilled nursing facility rejected the patient upon arrival.  Patient is resting in bed this morning, eating breakfast.  He does not have any complaints.  He is hopeful to be able to be off bedrest soon.  He does not report any pain.  No fevers, chills, nausea, vomiting, chest pain, shortness of breath  Objective: Vital signs in last 24 hours: Temp:  [97.9 F (36.6 C)-98.4 F (36.9 C)] 98.3 F (36.8 C) (05/24 0650) Pulse Rate:  [86-94] 94 (05/24 0650) Resp:  [16-17] 17 (05/24 0650) BP: (143-176)/(72-80) 157/79 (05/24 0650) SpO2:  [93 %-97 %] 93 % (05/24 0650) Last BM Date: 09/14/20  Intake/Output from previous day: 05/23 0701 - 05/24 0700 In: 217.7 [I.V.:217.7] Out: 450 [Urine:450] Intake/Output this shift: No intake/output data recorded.  General appearance: alert, cooperative, no distress and Elderly male, resting in bed Head: Normocephalic, without obvious abnormality, atraumatic, Dressing noted over nasal wound/ulcerative lesion GI: Soft, nontender, no distention noted.  Output noted in colostomy. Extremities: extremities normal, atraumatic, no cyanosis or edema Incision/Wound: Abdominal binder on securing right hand to abdomen.  No additional incisional dehiscence noted on the right hand flap.  No cellulitic changes.  Nontender to palpation.  No purulent drainage noted.  Lab Results:  CBC Latest Ref Rng & Units 09/06/2020 08/30/2020  WBC 4.0 - 10.5 K/uL 9.7 5.2  Hemoglobin 13.0 - 17.0 g/dL 11.4(L) 10.4(L)  Hematocrit 39.0 - 52.0 % 34.1(L) 31.4(L)  Platelets 150 - 400 K/uL 261 185    BMET Recent Labs    09/13/20 1832  CREATININE 1.26*   PT/INR No results for input(s):  LABPROT, INR in the last 72 hours. ABG No results for input(s): PHART, HCO3 in the last 72 hours.  Invalid input(s): PCO2, PO2  Studies/Results: No results found.  Anti-infectives: Anti-infectives (From admission, onward)   None      Assessment/Plan: 80 year old male status post right hand reconstruction with abdominal flap. He was discharged yesterday to [facility] in [place].  Upon arrival to the skilled nursing facility patient was transported back to the hospital without MD orders.  Patient was readmitted.  DVT prophylaxis with Lovenox every 24 hours. Heart healthy diet Incentive spirometer Continue with wound care to right hand.  Abdominal binder to remain intact to prevent graft dehiscence.  Remain on bedrest.  PT OT reconsulted since patient was discharged and readmitted.  Air mattress bed.  Milk of magnesia and MiraLAX for constipation/postoperative ileus -this has resolved patient has had consistent output daily.  Planning for inset and division of groin/abdomen flap in approximately 2 weeks.    LOS: 1 day    Charlies Constable, PA-C 09/14/2020

## 2020-09-14 NOTE — Progress Notes (Signed)
Tony Fleming has accepted pt for SNF placement and will receive on tomorrow. Pt and pt's sister [name] made aware and are in agreement with d/c plan to [facility]. NCM made Dr. Claudia Desanctis office made aware of d/c plan. Per Frances Furbish pt's next surgical date moved to 5/31 @ 5:30 pm @ [facility]. Per [facility] supervisor transportation arrangements will be made for pt to get to hospital for surgery on 5/31. TOC team will continue to monitor and assist with TOC needs. Whitman Hero RN,BSN,CM
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
## 2020-09-15 ENCOUNTER — Inpatient Hospital Stay
Admission: RE | Admit: 2020-09-15 | Discharge: 2020-11-22 | Disposition: E | Payer: Medicare Other | Source: Ambulatory Visit | Attending: Internal Medicine | Admitting: Internal Medicine

## 2020-09-15 NOTE — Progress Notes (Signed)
[facility]. Pt was transported out by [facility]. IV was removed with catheter intact.

## 2020-09-15 NOTE — Plan of Care (Signed)
  Problem: Education: Goal: Knowledge of General Education information will improve Description: Including pain rating scale, medication(s)/side effects and non-pharmacologic comfort measures Outcome: Adequate for Discharge   Problem: Health Behavior/Discharge Planning: Goal: Ability to manage health-related needs will improve Outcome: Adequate for Discharge   Problem: Clinical Measurements: Goal: Ability to maintain clinical measurements within normal limits will improve Outcome: Adequate for Discharge Goal: Will remain free from infection Outcome: Adequate for Discharge Goal: Diagnostic test results will improve Outcome: Adequate for Discharge Goal: Respiratory complications will improve Outcome: Adequate for Discharge Goal: Cardiovascular complication will be avoided Outcome: Adequate for Discharge   Problem: Activity: Goal: Risk for activity intolerance will decrease Outcome: Adequate for Discharge   Problem: Nutrition: Goal: Adequate nutrition will be maintained Outcome: Adequate for Discharge   Problem: Coping: Goal: Level of anxiety will decrease Outcome: Adequate for Discharge   Problem: Elimination: Goal: Will not experience complications related to bowel motility Outcome: Adequate for Discharge Goal: Will not experience complications related to urinary retention Outcome: Adequate for Discharge   Problem: Pain Managment: Goal: General experience of comfort will improve Outcome: Adequate for Discharge   Problem: Safety: Goal: Ability to remain free from injury will improve Outcome: Adequate for Discharge   Problem: Skin Integrity: Goal: Risk for impaired skin integrity will decrease Outcome: Adequate for Discharge   Problem: Acute Rehab PT Goals(only PT should resolve) Goal: Pt Will Go Supine/Side To Sit Outcome: Adequate for Discharge Goal: Pt Will Go Sit To Supine/Side Outcome: Adequate for Discharge Goal: Patient Will Transfer Sit To/From  Stand Outcome: Adequate for Discharge Goal: Pt Will Ambulate Outcome: Adequate for Discharge Goal: Pt/caregiver will Perform Home Exercise Program Outcome: Adequate for Discharge

## 2020-09-15 NOTE — TOC Transition Note (Signed)
Transition of Care [facility]) - CM/SW Discharge Note   Patient Details  Name: Tony Fleming MRN: [id number] Date of Birth: Aug 30, 1940  [facility] CM/SW Contact:  Sharin Mons, RN Phone Number: 09/14/2020, 9:44 AM   Clinical Narrative:    Patient will DC to: [facility] Anticipated DC date: 09/02/2020 Family notified: yes, Tony Fleming (sister) Transport by: Tony Fleming   Per MD patient ready for DC today. RN, patient, patient's family, and facility notified of DC. Discharge Summary and FL2 sent to facility. RN to call report prior to discharge [phone]). DC packet on chart. Ambulance transport requested for patient.   Tony Fleming (Sister)     [phone]      Pt with f/u surgical date 5/31 with Dr. Claudia Desanctis @ 5:30 pm , [facility] OR. [place] to assist with transportation to [facility].  RNCM will sign off for now as intervention is no longer needed. Please consult [place] again if new needs arise.     Final next level of care: Skilled Nursing Facility Barriers to Discharge: No Barriers Identified   Patient Goals and CMS Choice        Discharge Placement                       Discharge Plan and Services                                     Social Determinants of Health (SDOH) Interventions     Readmission Risk Interventions No flowsheet data found.

## 2020-09-15 NOTE — Discharge Summary (Signed)
Physician Discharge Summary  Patient ID: Tony Fleming MRN: [id number] DOB/AGE: [birth year] 82 y.o.  Admit date: 09/02/2020 Discharge date: 09/21/2020  Admission Diagnoses: Open wound of right hand without foreign body, unspecified wound type, initial encounter  Discharge Diagnoses:  Open wound of right hand without foreign body, unspecified wound type, initial encounter  Discharged Condition: good  Hospital Course: Patient is an 80 year old male who presented to the [facility] operating room on 08/30/2020 for right hand reconstruction for large defect of his right hand after Mohs procedure.  He subsequently underwent reconstruction of the right dorsal hand defect with superficial circumflex iliac artery fasciocutaneous flap with Dr. Claudia Desanctis on 08/30/2020.  He was subsequently discharged to [facility] skilled nursing facility on 09/13/20.  However, the patient returned to [facility] the same day as the skilled nursing facility was unable to provide care for the patient.  Patient was readmitted. He was then d/c'ed to [facility] on 09/03/2020 in stable condition.   He reports he is doing well today, he has no complaints.  Consults: None  Significant Diagnostic Studies: none  Treatments: IV hydration and analgesia: acetaminophen  Discharge Exam: There were no vitals taken for this visit. General appearance: alert, cooperative, no distress and resting in bed Head: Normocephalic, without obvious abnormality, atraumatic Nose: ulcerative nasal lesion noted on right nasal bridge GI: soft, non-tender; no masses, colostomy in place, stool output noted Extremities: extremities normal, atraumatic, no cyanosis or edema Pulses: 2+ and symmetric Neurologic: Grossly normal Incision/Wound: Right hand reconstruction overall fairly stable, incisional dehiscence of abdomen incision noted. No purulence noted. No foul odors. Dehiscence of medial/distal abdomen flap noted. Good cap refill of flap noted. No  cellulitic changes.   Disposition:  There are no questions and answers to display.             [facility] [address] [place], [place] [address] [phone]  Signed: Carola Rhine Shatonia Hoots 09/14/2020, 4:22 PM
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
## 2020-09-15 NOTE — Discharge Summary (Signed)
Physician Discharge Summary  Patient ID: Tony Fleming MRN: [id number] DOB/AGE: 01/10/1941 80 y.o.  Admit date: 09/13/2020 Discharge date: 09/09/2020  Admission Diagnoses: Right hand defect  Discharge Diagnoses:  Active Problems:   H/O open hand wound   Discharged Condition: good  Hospital Course: Patient was admitted for abdominally-based flap for right hand defect.  He tolerated the procedure fine.  He then waited in the hospital quite some time to find placement.  He is done well.  Consults: None  Significant Diagnostic Studies: None  Treatments: Surgery  Discharge Exam: Blood pressure (!) 176/80, pulse 91, temperature 97.9 F (36.6 C), temperature source Oral, resp. rate 16, SpO2 95 %. General appearance: alert, cooperative and no distress  Abdominal flap is intact to right hand.  It secured with a abdominal binder.  Disposition: Discharge disposition: 03-Skilled Nursing Facility           Signed: [name] 09/03/2020, 9:46 AM

## 2020-09-16 ENCOUNTER — Encounter: Payer: Self-pay | Admitting: Adult Health

## 2020-09-16 ENCOUNTER — Non-Acute Institutional Stay (SKILLED_NURSING_FACILITY): Payer: Medicare Other | Admitting: Adult Health

## 2020-09-16 ENCOUNTER — Telehealth: Payer: Self-pay

## 2020-09-16 DIAGNOSIS — K9189 Other postprocedural complications and disorders of digestive system: Secondary | ICD-10-CM

## 2020-09-16 DIAGNOSIS — K567 Ileus, unspecified: Secondary | ICD-10-CM | POA: Insufficient documentation

## 2020-09-16 DIAGNOSIS — I10 Essential (primary) hypertension: Secondary | ICD-10-CM | POA: Diagnosis not present

## 2020-09-16 DIAGNOSIS — D0461 Carcinoma in situ of skin of right upper limb, including shoulder: Secondary | ICD-10-CM | POA: Insufficient documentation

## 2020-09-16 DIAGNOSIS — C44622 Squamous cell carcinoma of skin of right upper limb, including shoulder: Secondary | ICD-10-CM

## 2020-09-16 DIAGNOSIS — E559 Vitamin D deficiency, unspecified: Secondary | ICD-10-CM

## 2020-09-16 DIAGNOSIS — E43 Unspecified severe protein-calorie malnutrition: Secondary | ICD-10-CM | POA: Insufficient documentation

## 2020-09-16 DIAGNOSIS — J45909 Unspecified asthma, uncomplicated: Secondary | ICD-10-CM

## 2020-09-16 DIAGNOSIS — E538 Deficiency of other specified B group vitamins: Secondary | ICD-10-CM | POA: Diagnosis not present

## 2020-09-16 DIAGNOSIS — K5909 Other constipation: Secondary | ICD-10-CM | POA: Diagnosis not present

## 2020-09-16 NOTE — Progress Notes (Signed)
Roetta Sessions, PA returned call and stated to hold Lovenox 24 hours prior to surgery.

## 2020-09-16 NOTE — Progress Notes (Addendum)
EKG: 08/30/20 CXR: 08/26/08 ECHO: denies Stress Test: denies Cardiac Cath: denies  Fasting Blood Sugar- na Checks Blood Sugar__na_ times a day  OSA/CPAP: No  ASA: No Blood Thinners: Yes, Lovenox.  Dr. Keane Scrape office stated to hold Lovenox 24 hours prior to surgery.     Covid test DOS.  Is at [facility].   Anesthesia Review: Yes, abnormal EKG.   Patient denies shortness of breath, fever, cough, and chest pain at PAT appointment.  Patient verbalized understanding of instructions provided today at the PAT appointment.  Patient asked to review instructions at home and day of surgery.

## 2020-09-16 NOTE — Progress Notes (Signed)
Location:  [place] Room Number: 401 Place of Service:  SNF (31)   CODE STATUS: full code   Allergies  Allergen Reactions  . Penicillins Rash    Tolerated ceftriaxone at OSH     Chief Complaint  Patient presents with  . Hospitalization Follow-up    HPI:  He is a 80 year old man who has been hospitalized from 09-13-20 through 09/18/2020. His medical history includes: hypertension; anemia; squamous cell carcinoma, colostomy. He had a right hand squamous cell carcinoma on the dorsal side. It was excised by Mohs surgery which resulted in complex wound defect with tendon and bone exposure. On 08-30-20 he underwent reconstruction with fasciocutaneous abdomen groin flap. He suffered a post operative ileus vs SBO on 09-06-20 which did resolve with treatment. He is here for wound management and short term rehab with his goal to return back home. He is very hoh which makes getting a review of symptoms difficult. He does deny any pain. He is presently on bed rest to protect his right hand. He will continue to be followed for his chronic illnesses including: Squamous cell carcinoma right dorsal hand:     Chronic constipation / postoperative ileus:   Essential hypertension: . Vitamin B 12 deficiency:  Past Medical History:  Diagnosis Date  . Anemia   . Arthritis   . Asthma   . 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 Hypertension   . Urinary incontinence     Past Surgical History:  Procedure Laterality Date  . CATARACT EXTRACTION Bilateral   . COLONOSCOPY    . COLOSTOMY    . DEBRIDEMENT AND CLOSURE WOUND N/A 08/30/2020   Procedure: Abdominal flap/closure;  Surgeon: [name], MD;  Location: [place];  Service: Plastics;  Laterality: N/A;  . ESOPHAGOGASTRODUODENOSCOPY    . MUSCLE FLAP CLOSURE Right 08/30/2020   Procedure: right hand reconstruction;  Surgeon: [name], MD;  Location: [place];  Service: Plastics;  Laterality: Right;  2 hours    Social History    Socioeconomic History  . Marital status: Single    Spouse name: Not on file  . Number of children: Not on file  . Years of education: Not on file  . Highest education level: Not on file  Occupational History  . Not on file  Tobacco Use  . Smoking status: Never Smoker  . Smokeless tobacco: Never Used  Vaping Use  . Vaping Use: Never used  Substance and Sexual Activity  . Alcohol use: Never  . Drug use: Never  . Sexual activity: Not Currently  Other Topics Concern  . Not on file  Social History Narrative  . Not on file   Social Determinants of Health   Financial Resource Strain: Not on file  Food Insecurity: Not on file  Transportation Needs: Not on file  Physical Activity: Not on file  Stress: Not on file  Social Connections: Not on file  Intimate Partner Violence: Not on file   History reviewed. No pertinent family history.    VITAL SIGNS BP 139/79   Pulse 100   Temp 97.8 F (36.6 C)   Resp 20   Ht 5\' 11"  (1.803 m)   Wt 160 lb (72.6 kg)   SpO2 94%   BMI 22.32 kg/m   Outpatient Encounter Medications as of 09/16/2020  Medication Sig  . calcium carbonate (OS-CAL - DOSED IN MG OF ELEMENTAL CALCIUM) 1250 (500 Ca) MG tablet Take 1 tablet by  mouth daily with breakfast.  . enoxaparin (LOVENOX) 40 MG/0.4ML injection Inject 0.4 mLs (40 mg total) into the skin daily for 21 days.  Marland Kitchen lisinopril (ZESTRIL) 10 MG tablet Take 10 mg by mouth in the morning and at bedtime.  . magnesium hydroxide (MILK OF MAGNESIA) 400 MG/5ML suspension Take 15 mLs by mouth daily.  . meclizine (ANTIVERT) 12.5 MG tablet Take 12.5 mg by mouth 3 (three) times daily as needed for dizziness.  . Multiple Vitamins-Minerals (MULTIVITAMIN WITH MINERALS) tablet Take 1 tablet by mouth daily.  . polyethylene glycol (MIRALAX / GLYCOLAX) 17 g packet Take 17 g by mouth daily.  . traMADol (ULTRAM) 50 MG tablet Take 1 tablet by mouth every 6 (six) hours as needed for severe pain.  . vitamin B-12  (CYANOCOBALAMIN) 500 MCG tablet Take 500 mcg by mouth daily.  . Vitamin D, Ergocalciferol, (DRISDOL) 1.25 MG (50000 UNIT) CAPS capsule Take 50,000 Units by mouth every Wednesday.  . vitamin E 180 MG (400 UNITS) capsule Take 400 Units by mouth daily.  Grant Ruts INHUB 250-50 MCG/ACT AEPB Inhale 1 puff into the lungs 2 (two) times daily.   No facility-administered encounter medications on file as of 09/16/2020.     SIGNIFICANT DIAGNOSTIC EXAMS  TODAY  09-08-20: KUB:  Progressive gaseous distension of the small bowel diffusely and evacuation of gas and stool within the colon suggesting changes of a distal partial or developing small bowel obstruction.  LABS REVIEWED:   08-30-20: wbc 5.2; hgb 10.4; hct 31.4; mcv 94.3 plt 185; glucose 100; bun 16; creat 1.15; k+ 3.5; na++ 138; ca 8.6 GFR>60 09-06-20: wbc 9.7; hgb 11.4; hct 34.1; mcv 92.4 plt 261; glucose 153; bun 49; creat 1.52; k+ 3.5 na++ 139; ca 8.7; GFR 46; liver normal albumin 2.8  09-08-20: glucose 125; bun 47; creat 1.33; k+ 3.6; na++ 142; ca 8.9; GFR 54   Review of Systems  Reason unable to perform ROS: very HOH.  he does deny any pain  Physical Exam Constitutional:      General: He is not in acute distress.    Appearance: He is well-developed. He is not diaphoretic.  HENT:     Head:     Comments: Very HOH Neck:     Thyroid: No thyromegaly.  Cardiovascular:     Rate and Rhythm: Normal rate and regular rhythm.     Pulses: Normal pulses.     Heart sounds: Normal heart sounds.  Pulmonary:     Effort: Pulmonary effort is normal. No respiratory distress.     Breath sounds: Normal breath sounds.  Abdominal:     General: Bowel sounds are normal. There is no distension.     Palpations: Abdomen is soft.     Tenderness: There is no abdominal tenderness.     Comments: Colostomy Right dorsal skin flap to abdomen with sutures intact; abdominal binder in place.   Musculoskeletal:     Cervical back: Neck supple.     Right lower leg: No  edema.     Left lower leg: No edema.     Comments: Right hand in abdominal binder   Lymphadenopathy:     Cervical: No cervical adenopathy.  Skin:    General: Skin is warm and dry.  Neurological:     Mental Status: He is alert and oriented to person, place, and time.  Psychiatric:        Mood and Affect: Mood normal.         ASSESSMENT/ PLAN:  TODAY  1. Squamous cell carcinoma right dorsal hand: is stable: is status post Mohs surgery: status post reconstruction with fasciocutaneous abdomen to groin flap:  Binder in place to protect right arm from movement. No signs of infection present to incision line. Has ultram 50 mg every 6 hours as needed; lovenox 40 mg daily for total 21 days  2. Chronic constipation / postoperative ileus: is stable will continue MOM 15 mL daily and miralax daily   3. Essential hypertension: is stable b/p 139/79 will continue lisinopril 10 mg daily   4. Vitamin B 12 deficiency: is stable will continue 500 mcg daily   5. Vitamin D deficiency: is stable will continue vitamin d 50,000 units weekly   6. Chronic asthma: is stable will continue advair 250/50 mcg 1 puff twice daily  7, protein calorie malnutrition severe: albumin 2.8 will begin prostat 30 mL twice daily and ensure twice daily    Will check bmp vit b 12 vit d h/h   Time spent with patient: 50 minutes: goals of care; medications; wound treatments; therapy needs.    Ok Edwards NP [facility]  Contact [phone] Monday through Friday 8am- 5pm  After hours call [phone]

## 2020-09-16 NOTE — Progress Notes (Signed)
Surgical Instructions    Your procedure is scheduled on 09/21/20.  Report to [facility] Main Entrance "A" at 2:45 P.M.., then check in with the Admitting office.  Call this number if you have problems the morning of surgery:  [phone]   If you have any questions prior to your surgery date call [phone]: Open Monday-Friday 8am-4pm    Remember:  Do not eat  Or drink after midnight the night before your surgery    Take these medicines the morning of surgery with A SIP OF WATER: WIXELA INHUB Inhaler traMADol (ULTRAM) - if needed meclizine (ANTIVERT) - if needed  HOLD Lovenox 24 hours prior to surgery   As of today, STOP taking any Aspirin (unless otherwise instructed by your surgeon) Aleve, Naproxen, Ibuprofen, Motrin, Advil, Goody's, BC's, all herbal medications, fish oil, and all vitamins.                     Do not wear jewelry            Do not wear lotions, powders, colognes, or deodorant.            Do not shave 48 hours prior to surgery.  Men may shave face and neck.            Do not bring valuables to the hospital.            [facility] is not responsible for any belongings or valuables.  Do NOT Smoke (Tobacco/Vaping) or drink Alcohol 24 hours prior to your procedure If you use a CPAP at night, you may bring all equipment for your overnight stay.   Contacts, glasses, dentures or bridgework may not be worn into surgery, please bring cases for these belongings   For patients admitted to the hospital, discharge time will be determined by your treatment team.   Patients discharged the day of surgery will not be allowed to drive home, and someone needs to stay with them for 24 hours.    Special instructions:    Oral Hygiene is also important to reduce your risk of infection.  Remember - BRUSH YOUR TEETH THE MORNING OF SURGERY WITH YOUR REGULAR TOOTHPASTE    Day of Surgery:  Take a shower Wear Clean/Comfortable clothing the morning of surgery Do not apply  any deodorants/lotions.   Remember to brush your teeth WITH YOUR REGULAR TOOTHPASTE.

## 2020-09-16 NOTE — Telephone Encounter (Signed)
Received call from [place] at [facility] requesting clarification on Lovenox instructions prior to surgery. Tammy can be reached at: [phone].

## 2020-09-16 NOTE — Telephone Encounter (Signed)
Called and spoke with Tammy and informed her that patient can hold Lovenox 24 hours prior to surgery.  Appreciate their assistance

## 2020-09-17 ENCOUNTER — Non-Acute Institutional Stay (SKILLED_NURSING_FACILITY): Payer: Medicare Other | Admitting: Internal Medicine

## 2020-09-17 ENCOUNTER — Other Ambulatory Visit: Payer: Self-pay | Admitting: Adult Health

## 2020-09-17 ENCOUNTER — Encounter (HOSPITAL_COMMUNITY): Payer: Self-pay | Admitting: Physician Assistant

## 2020-09-17 ENCOUNTER — Encounter: Payer: Self-pay | Admitting: Internal Medicine

## 2020-09-17 DIAGNOSIS — I10 Essential (primary) hypertension: Secondary | ICD-10-CM | POA: Diagnosis not present

## 2020-09-17 DIAGNOSIS — R739 Hyperglycemia, unspecified: Secondary | ICD-10-CM | POA: Insufficient documentation

## 2020-09-17 DIAGNOSIS — K9189 Other postprocedural complications and disorders of digestive system: Secondary | ICD-10-CM

## 2020-09-17 DIAGNOSIS — S61401D Unspecified open wound of right hand, subsequent encounter: Secondary | ICD-10-CM

## 2020-09-17 DIAGNOSIS — N1831 Chronic kidney disease, stage 3a: Secondary | ICD-10-CM

## 2020-09-17 DIAGNOSIS — K567 Ileus, unspecified: Secondary | ICD-10-CM

## 2020-09-17 DIAGNOSIS — E538 Deficiency of other specified B group vitamins: Secondary | ICD-10-CM

## 2020-09-17 DIAGNOSIS — N183 Chronic kidney disease, stage 3 unspecified: Secondary | ICD-10-CM | POA: Insufficient documentation

## 2020-09-17 MED ORDER — TRAMADOL HCL 50 MG PO TABS: 50.0000 mg | ORAL_TABLET | Freq: Four times a day (QID) | ORAL | 0 refills | Status: DC | PRN

## 2020-09-17 NOTE — Progress Notes (Signed)
NURSING HOME LOCATION: [facility]  ROOM NUMBER: [id number]  CODE STATUS:  Full Code  PCP:  Kristine Linea MD  This is a comprehensive admission note to this SNFperformed on this date less than 30 days from date of admission. Included are preadmission medical/surgical history; reconciled medication list; family history; social history and comprehensive review of systems.  Corrections and additions to the records were documented. Comprehensive physical exam was also performed. Additionally a clinical summary was entered for each active diagnosis pertinent to this admission in the Problem List to enhance continuity of care.  HPI: His history is complex and involves 2 hospitalizations.  He was originally hospitalized 5/9 - 09/09/2020 presenting for right hand reconstruction for large defect of his right hand following Mohs surgery for squamous cell cancer.   On 5/9 Dr. Mingo Amber performed reconstruction of the right dorsal hand defect with superficial circumflex iliac artery fasciocutaneous flap.   Postop he developed some abdominal distention which improved with MiraLAX and milk of magnesia.  He remained hospitalized awaiting SNF placement.  He has had colostomy remotely. Predischarge there was consistent output with improvement in the abdominal distention.   He was discharged to a SNF for rehab but apparently was not admitted by the facility necessitating return to the hospital until he could be placed here 5/25 for rehab & wound care..  Past medical and surgical history: Includes history of vitamin B-12 deficiency, history of lymphoma of the groin, essential hypertension, and history of asthma. Surgeries and procedures include colostomy and EGD.  Social history: Never drank alcohol or smoked cigarettes.  Family history: Noncontributory due to advanced age.   Review of systems: PROFOUND hearing loss prevented ROS completion.  He stated that he "felt awful".  When I asked  why, his response was "something messed up".  He did not elaborate.  He could not even hear shouted questions.  Physical exam:  Pertinent or positive findings: He appears chronically ill and his age.  A large eschar was present over the right lateral nose.  A smaller eschar was present over the right cheek.  Initially he was asleep and seemed to exhibit some apnea.  Once awake he remained somnolent, drifting off to sleep intermittently during the interview.  His lips are thin &  exhibit asymmetry.There appears to be hematoma like lesion over the mid lower lip.  He has apparently only 2 upper teeth remaining and is otherwise edentulous.  Speech is garbled.  He exhibits slight tach.  Breath sounds are decreased.  Bowel sounds are active.  There is an abdominal binder in place holding the right upper extremity to his thorax and abdomen.  Apparently there is also surgical attachment but visualization was limited by extensive dressings over the distal right upper extremity.  The R forearm and hand were visibly edematous.  Pedal pulses are decreased.  There is 1/2+ edema in the lateral malleolar area of the feet.  General appearance: no acute distress, increased work of breathing is present.   Lymphatic: No lymphadenopathy about the head, neck, axilla. Eyes: No conjunctival inflammation or lid edema is present. There is no scleral icterus. Ears:  External ear exam shows no significant lesions or deformities.   Nose: Nasal mucosa are pink and moist without lesions, exudates Neck:  No thyromegaly, masses, tenderness noted.    Heart:  No murmur, click, rub.  Lungs:  without wheezes, rhonchi, rales, rubs. Abdomen: Bowel sounds are normal. Abdominal exam limited by binder GU: Deferred  Extremities:  No cyanosis, clubbing. Neurologic exam: Balance, Rhomberg, finger to nose testing could not be completed due to clinical state Skin: Warm & dry w/o tenting. No significant rash.  See clinical summary under each  active problem in the Problem List with associated updated therapeutic plan

## 2020-09-17 NOTE — Assessment & Plan Note (Signed)
BP controlled; no change in antihypertensive medications  

## 2020-09-17 NOTE — Assessment & Plan Note (Signed)
Current H/H 11.4/34.1 with normochromic, normocytic indices.

## 2020-09-17 NOTE — Assessment & Plan Note (Addendum)
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

## 2020-09-17 NOTE — Assessment & Plan Note (Addendum)
Wound care at SNF by Wound Care Nurse. Follow-up surgery schedule 5/31.   Lovenox will be held preop.

## 2020-09-18 NOTE — Assessment & Plan Note (Signed)
If glucoses consistently > 159, check A1c

## 2020-09-18 NOTE — Assessment & Plan Note (Signed)
Clinically no ileus based on active bowel sounds on exam

## 2020-09-18 NOTE — Patient Instructions (Signed)
See assessment and plan under each diagnosis in the problem list and acutely for this visit 

## 2020-09-20 ENCOUNTER — Encounter (HOSPITAL_COMMUNITY)
Admission: RE | Admit: 2020-09-20 | Discharge: 2020-09-20 | Disposition: A | Discharge status: Home / Self Care | Payer: Medicare Other | Source: Skilled Nursing Facility | Attending: Adult Health | Admitting: Adult Health

## 2020-09-20 DIAGNOSIS — D649 Anemia, unspecified: Secondary | ICD-10-CM | POA: Insufficient documentation

## 2020-09-20 LAB — CBC WITH DIFFERENTIAL/PLATELET
Abs Immature Granulocytes: 0.07 10*3/uL (ref 0.00–0.07)
Basophils Absolute: 0 10*3/uL (ref 0.0–0.1)
Basophils Relative: 0 %
Eosinophils Absolute: 0.1 10*3/uL (ref 0.0–0.5)
Eosinophils Relative: 1 %
HCT: 30.2 % — ABNORMAL LOW (ref 39.0–52.0)
Hemoglobin: 9.4 g/dL — ABNORMAL LOW (ref 13.0–17.0)
Immature Granulocytes: 1 %
Lymphocytes Relative: 9 %
Lymphs Abs: 0.8 10*3/uL (ref 0.7–4.0)
MCH: 30.6 pg (ref 26.0–34.0)
MCHC: 31.1 g/dL (ref 30.0–36.0)
MCV: 98.4 fL (ref 80.0–100.0)
Monocytes Absolute: 0.7 10*3/uL (ref 0.1–1.0)
Monocytes Relative: 7 %
Neutro Abs: 7.6 10*3/uL (ref 1.7–7.7)
Neutrophils Relative %: 82 %
Platelets: 400 10*3/uL (ref 150–400)
RBC: 3.07 MIL/uL — ABNORMAL LOW (ref 4.22–5.81)
RDW: 14.7 % (ref 11.5–15.5)
WBC: 9.3 10*3/uL (ref 4.0–10.5)
nRBC: 0 % (ref 0.0–0.2)

## 2020-09-20 LAB — COMPREHENSIVE METABOLIC PANEL
ALT: 32 U/L (ref 0–44)
AST: 29 U/L (ref 15–41)
Albumin: 2.1 g/dL — ABNORMAL LOW (ref 3.5–5.0)
Alkaline Phosphatase: 87 U/L (ref 38–126)
Anion gap: 6 (ref 5–15)
BUN: 55 mg/dL — ABNORMAL HIGH (ref 8–23)
CO2: 29 mmol/L (ref 22–32)
Calcium: 7.6 mg/dL — ABNORMAL LOW (ref 8.9–10.3)
Chloride: 107 mmol/L (ref 98–111)
Creatinine, Ser: 2 mg/dL — ABNORMAL HIGH (ref 0.61–1.24)
GFR, Estimated: 33 mL/min — ABNORMAL LOW (ref >60)
Glucose, Bld: 114 mg/dL — ABNORMAL HIGH (ref 70–99)
Potassium: 4 mmol/L (ref 3.5–5.1)
Sodium: 142 mmol/L (ref 135–145)
Total Bilirubin: 0.5 mg/dL (ref 0.3–1.2)
Total Protein: 5.8 g/dL — ABNORMAL LOW (ref 6.5–8.1)

## 2020-09-21 ENCOUNTER — Encounter: Payer: Self-pay | Admitting: Adult Health

## 2020-09-21 ENCOUNTER — Non-Acute Institutional Stay (SKILLED_NURSING_FACILITY): Payer: Medicare Other | Admitting: Adult Health

## 2020-09-21 ENCOUNTER — Other Ambulatory Visit (HOSPITAL_COMMUNITY)
Admission: RE | Admit: 2020-09-21 | Discharge: 2020-09-21 | Disposition: A | Discharge status: Home / Self Care | Payer: Medicare Other | Source: Skilled Nursing Facility | Attending: Adult Health | Admitting: Adult Health

## 2020-09-21 ENCOUNTER — Inpatient Hospital Stay (HOSPITAL_COMMUNITY): Admission: RE | Admit: 2020-09-21 | Payer: Medicare Other | Source: Home / Self Care | Admitting: Plastic Surgery

## 2020-09-21 DIAGNOSIS — J189 Pneumonia, unspecified organism: Secondary | ICD-10-CM | POA: Diagnosis not present

## 2020-09-21 DIAGNOSIS — N179 Acute kidney failure, unspecified: Secondary | ICD-10-CM | POA: Diagnosis not present

## 2020-09-21 DIAGNOSIS — I10 Essential (primary) hypertension: Secondary | ICD-10-CM | POA: Insufficient documentation

## 2020-09-21 DIAGNOSIS — E43 Unspecified severe protein-calorie malnutrition: Secondary | ICD-10-CM

## 2020-09-21 DIAGNOSIS — N1832 Chronic kidney disease, stage 3b: Secondary | ICD-10-CM

## 2020-09-21 DIAGNOSIS — D631 Anemia in chronic kidney disease: Secondary | ICD-10-CM

## 2020-09-21 LAB — HEMOGLOBIN AND HEMATOCRIT, BLOOD
HCT: 29.1 % — ABNORMAL LOW (ref 39.0–52.0)
Hemoglobin: 9.1 g/dL — ABNORMAL LOW (ref 13.0–17.0)

## 2020-09-21 LAB — BASIC METABOLIC PANEL
Anion gap: 9 (ref 5–15)
BUN: 54 mg/dL — ABNORMAL HIGH (ref 8–23)
CO2: 27 mmol/L (ref 22–32)
Calcium: 7.8 mg/dL — ABNORMAL LOW (ref 8.9–10.3)
Chloride: 106 mmol/L (ref 98–111)
Creatinine, Ser: 1.99 mg/dL — ABNORMAL HIGH (ref 0.61–1.24)
GFR, Estimated: 33 mL/min — ABNORMAL LOW (ref >60)
Glucose, Bld: 114 mg/dL — ABNORMAL HIGH (ref 70–99)
Potassium: 4.2 mmol/L (ref 3.5–5.1)
Sodium: 142 mmol/L (ref 135–145)

## 2020-09-21 LAB — EXPECTORATED SPUTUM ASSESSMENT W GRAM STAIN, RFLX TO RESP C

## 2020-09-21 LAB — VITAMIN D 25 HYDROXY (VIT D DEFICIENCY, FRACTURES): Vit D, 25-Hydroxy: 87.65 ng/mL (ref 30–100)

## 2020-09-21 LAB — VITAMIN B12: Vitamin B-12: 1059 pg/mL — ABNORMAL HIGH (ref 180–914)

## 2020-09-21 SURGERY — CLOSURE, WOUND, USING MUSCLE FLAP
Anesthesia: General | Site: Groin

## 2020-09-21 NOTE — Progress Notes (Signed)
Location:  [facility] Room Number: 131-P Place of Service:  SNF (31)   CODE STATUS: Full Code   Allergies  Allergen Reactions  . Penicillins Rash    Tolerated ceftriaxone at OSH     Chief Complaint  Patient presents with  . Acute Visit    Mental status change     HPI:  He hs developed a cough; wheezing; with purulent sputum; over the past weekend. No reports of fevers present. His po intake has declined; no reports of chest pain. He is presently on bedrest   Past Medical History:  Diagnosis Date  . Anemia   . Arthritis   . Asthma   . 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 Hypertension   . Urinary incontinence     Past Surgical History:  Procedure Laterality Date  . CATARACT EXTRACTION Bilateral   . COLONOSCOPY    . COLOSTOMY    . DEBRIDEMENT AND CLOSURE WOUND N/A 08/30/2020   Procedure: Abdominal flap/closure;  Surgeon: [name], MD;  Location: [facility];  Service: Plastics;  Laterality: N/A;  . ESOPHAGOGASTRODUODENOSCOPY    . MUSCLE FLAP CLOSURE Right 08/30/2020   Procedure: right hand reconstruction;  Surgeon: [name], MD;  Location: [facility];  Service: Plastics;  Laterality: Right;  2 hours    Social History   Socioeconomic History  . Marital status: Single    Spouse name: Not on file  . Number of children: Not on file  . Years of education: Not on file  . Highest education level: Not on file  Occupational History  . Not on file  Tobacco Use  . Smoking status: Never Smoker  . Smokeless tobacco: Never Used  Vaping Use  . Vaping Use: Never used  Substance and Sexual Activity  . Alcohol use: Never  . Drug use: Never  . Sexual activity: Not Currently  Other Topics Concern  . Not on file  Social History Narrative  . Not on file   Social Determinants of Health   Financial Resource Strain: Not on file  Food Insecurity: Not on file  Transportation Needs: Not on file  Physical Activity: Not on file  Stress: Not on file   Social Connections: Not on file  Intimate Partner Violence: Not on file   History reviewed. No pertinent family history.    VITAL SIGNS BP (!) 145/64   Pulse 100   Temp (!) 97.2 F (36.2 C)   Resp 20   SpO2 93%   Outpatient Encounter Medications as of 09/21/2020  Medication Sig  . Amino Acids-Protein Hydrolys (FEEDING SUPPLEMENT, PRO-STAT SUGAR FREE 64,) LIQD Take 30 mLs by mouth 3 (three) times daily with meals. for albumin 2.1  . cefTRIAXone 250 mg/mL in lidocaine (with preservative) injection Inject 1,000 mg into the muscle daily. X 1 week  . meclizine (ANTIVERT) 12.5 MG tablet Take 12.5 mg by mouth 3 (three) times daily as needed for dizziness.  . SODIUM CHLORIDE, EXTERNAL, 0.9 % SOLN Apply topically See admin instructions. 75 cc per hour; intravenous  Special Instructions: FOR 2 LITERS ONLY FOR ACUTE RENAL FAILURE  Every Shift; Day, Evening, Night  . traMADol (ULTRAM) 50 MG tablet Take 1 tablet (50 mg total) by mouth every 6 (six) hours as needed for severe pain.  Marland Kitchen UNABLE TO FIND Diet: Regular  . WIXELA INHUB 250-50 MCG/ACT AEPB Inhale 1 puff into the lungs 2 (two) times daily.  . [DISCONTINUED] calcium carbonate (OS-CAL - DOSED  IN MG OF ELEMENTAL CALCIUM) 1250 (500 Ca) MG tablet Take 1 tablet by mouth daily with breakfast.  . [DISCONTINUED] enoxaparin (LOVENOX) 40 MG/0.4ML injection Inject 0.4 mLs (40 mg total) into the skin daily for 21 days.  . [DISCONTINUED] lisinopril (ZESTRIL) 10 MG tablet Take 10 mg by mouth in the morning and at bedtime.  . [DISCONTINUED] magnesium hydroxide (MILK OF MAGNESIA) 400 MG/5ML suspension Take 15 mLs by mouth daily.  . [DISCONTINUED] Multiple Vitamins-Minerals (MULTIVITAMIN WITH MINERALS) tablet Take 1 tablet by mouth daily.  . [DISCONTINUED] polyethylene glycol (MIRALAX / GLYCOLAX) 17 g packet Take 17 g by mouth daily.  . [DISCONTINUED] vitamin B-12 (CYANOCOBALAMIN) 500 MCG tablet Take 500 mcg by mouth daily.  . [DISCONTINUED] Vitamin D,  Ergocalciferol, (DRISDOL) 1.25 MG (50000 UNIT) CAPS capsule Take 50,000 Units by mouth every Wednesday.  . [DISCONTINUED] vitamin E 180 MG (400 UNITS) capsule Take 400 Units by mouth daily.   No facility-administered encounter medications on file as of 09/21/2020.     SIGNIFICANT DIAGNOSTIC EXAMS   PREVIOUS   09-08-20: KUB:  Progressive gaseous distension of the small bowel diffusely and evacuation of gas and stool within the colon suggesting changes of a distal partial or developing small bowel obstruction.  TODAY   09-21-20: Chest x-ray: bilateral opacities.   LABS REVIEWED:   08-30-20: wbc 5.2; hgb 10.4; hct 31.4; mcv 94.3 plt 185; glucose 100; bun 16; creat 1.15; k+ 3.5; na++ 138; ca 8.6 GFR>60 09-06-20: wbc 9.7; hgb 11.4; hct 34.1; mcv 92.4 plt 261; glucose 153; bun 49; creat 1.52; k+ 3.5 na++ 139; ca 8.7; GFR 46; liver normal albumin 2.8  09-08-20: glucose 125; bun 47; creat 1.33; k+ 3.6; na++ 142; ca 8.9; GFR 54  TODAY  09-20-20: wbc 9.3; hgb 9,4; hct 30.2; mcv 98.4 plt 400; glucose 114; bun 55; creat 2.00; k+ 4.0; na++ 142; ca 7.6; GFR33; liver normal albumin 2.1 09-21-20: hgb 9.1; hct 29.1; glucose 114; bun 54; creat 1.99; k+ 4.2; na++ 142; ca 7.8; GFR 33    Review of Systems  Reason unable to perform ROS: very HOH.   Physical Exam Constitutional:      General: He is not in acute distress.    Appearance: He is well-developed. He is not diaphoretic.  HENT:     Ears:     Comments: Very HOH    Mouth/Throat:     Mouth: Mucous membranes are moist.     Pharynx: Oropharynx is clear.  Eyes:     Conjunctiva/sclera: Conjunctivae normal.  Neck:     Thyroid: No thyromegaly.  Cardiovascular:     Rate and Rhythm: Normal rate and regular rhythm.     Heart sounds: Normal heart sounds.  Pulmonary:     Effort: Pulmonary effort is normal. No respiratory distress.     Breath sounds: Wheezing and rhonchi present.  Abdominal:     General: Bowel sounds are normal. There is no  distension.     Palpations: Abdomen is soft.     Tenderness: There is no abdominal tenderness.     Comments: Colostomy Right dorsal skin flap to abdomen with sutures intact; abdominal binder in place  Musculoskeletal:     Cervical back: Neck supple.     Right lower leg: No edema.     Comments: Right hand in abdominal binder    Lymphadenopathy:     Cervical: No cervical adenopathy.  Skin:    General: Skin is warm and dry.  Neurological:  Mental Status: He is alert. Mental status is at baseline.  Psychiatric:        Mood and Affect: Mood normal.     ASSESSMENT/ PLAN:  TODAY  1. HCAP 2. Anemia due to stage 3b chronic kidney disease 3. Protein calorie malnutrition severe 4. Acute renal failure superimposed on stage 3b chronic kidney disease unspecified acute renal failure type:  Will have sputum sent off for culture Will begin rocephin 1 gm IM daily through 09-28-20 Will have staff use I/S four times daily  Will being iron daily  Will increase prostat to three times daily  Will give NS at 75 cc per hour for 2 liters Will repeat BMP 09-23-20.  Will monitor his status.   Time spent with patient 45 minutes: coordination of care; labs/ x-ray reviews/ medications.     Ok Edwards NP [facility]  Contact [phone] Monday through Friday 8am- 5pm  After hours call [phone]

## 2020-09-22 DEATH — deceased

## 2020-09-23 ENCOUNTER — Non-Acute Institutional Stay (SKILLED_NURSING_FACILITY): Payer: Medicare Other | Admitting: Adult Health

## 2020-09-23 ENCOUNTER — Other Ambulatory Visit (HOSPITAL_COMMUNITY)
Admission: RE | Admit: 2020-09-23 | Discharge: 2020-09-23 | Disposition: A | Discharge status: Home / Self Care | Payer: Medicare Other | Source: Skilled Nursing Facility | Attending: Adult Health | Admitting: Adult Health

## 2020-09-23 ENCOUNTER — Encounter: Payer: Self-pay | Admitting: Adult Health

## 2020-09-23 DIAGNOSIS — J189 Pneumonia, unspecified organism: Secondary | ICD-10-CM | POA: Insufficient documentation

## 2020-09-23 DIAGNOSIS — J150 Pneumonia due to Klebsiella pneumoniae: Secondary | ICD-10-CM

## 2020-09-23 DIAGNOSIS — D649 Anemia, unspecified: Secondary | ICD-10-CM | POA: Diagnosis present

## 2020-09-23 DIAGNOSIS — J156 Pneumonia due to other aerobic Gram-negative bacteria: Secondary | ICD-10-CM

## 2020-09-23 DIAGNOSIS — D631 Anemia in chronic kidney disease: Secondary | ICD-10-CM | POA: Insufficient documentation

## 2020-09-23 DIAGNOSIS — N183 Chronic kidney disease, stage 3 unspecified: Secondary | ICD-10-CM | POA: Insufficient documentation

## 2020-09-23 LAB — CULTURE, RESPIRATORY W GRAM STAIN

## 2020-09-23 LAB — BASIC METABOLIC PANEL
Anion gap: 9 (ref 5–15)
BUN: 37 mg/dL — ABNORMAL HIGH (ref 8–23)
CO2: 25 mmol/L (ref 22–32)
Calcium: 7.7 mg/dL — ABNORMAL LOW (ref 8.9–10.3)
Chloride: 110 mmol/L (ref 98–111)
Creatinine, Ser: 1.55 mg/dL — ABNORMAL HIGH (ref 0.61–1.24)
GFR, Estimated: 45 mL/min — ABNORMAL LOW (ref >60)
Glucose, Bld: 78 mg/dL (ref 70–99)
Potassium: 4 mmol/L (ref 3.5–5.1)
Sodium: 144 mmol/L (ref 135–145)

## 2020-09-23 LAB — IRON AND TIBC
Iron: 16 ug/dL — ABNORMAL LOW (ref 45–182)
Saturation Ratios: 9 % — ABNORMAL LOW (ref 17.9–39.5)
TIBC: 188 ug/dL — ABNORMAL LOW (ref 250–450)
UIBC: 172 ug/dL

## 2020-09-23 NOTE — Progress Notes (Signed)
Location:  [facility] Room Number: 131-P Place of Service:  SNF (31)   CODE STATUS: Full  Allergies  Allergen Reactions  . Penicillins Rash    Tolerated ceftriaxone at OSH     Chief Complaint  Patient presents with  . Acute Visit    Follow up sputum culture     HPI:  He is present being treated with ceftriaxone 1 gm daily through 09-28-20 for bilateral pneumonia. He is tolerating without difficulty. He is using his I/S four times daily. He still has a cough which is improving. There are no reports of fevers present.   Past Medical History:  Diagnosis Date  . Anemia   . Arthritis   . Asthma   . 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 Hypertension   . Urinary incontinence     Past Surgical History:  Procedure Laterality Date  . CATARACT EXTRACTION Bilateral   . COLONOSCOPY    . COLOSTOMY    . DEBRIDEMENT AND CLOSURE WOUND N/A 08/30/2020   Procedure: Abdominal flap/closure;  Surgeon: [name], MD;  Location: [facility];  Service: Plastics;  Laterality: N/A;  . ESOPHAGOGASTRODUODENOSCOPY    . MUSCLE FLAP CLOSURE Right 08/30/2020   Procedure: right hand reconstruction;  Surgeon: [name], MD;  Location: [facility];  Service: Plastics;  Laterality: Right;  2 hours    Social History   Socioeconomic History  . Marital status: Single    Spouse name: Not on file  . Number of children: Not on file  . Years of education: Not on file  . Highest education level: Not on file  Occupational History  . Not on file  Tobacco Use  . Smoking status: Never Smoker  . Smokeless tobacco: Never Used  Vaping Use  . Vaping Use: Never used  Substance and Sexual Activity  . Alcohol use: Never  . Drug use: Never  . Sexual activity: Not Currently  Other Topics Concern  . Not on file  Social History Narrative  . Not on file   Social Determinants of Health   Financial Resource Strain: Not on file  Food Insecurity: Not on file  Transportation Needs: Not on  file  Physical Activity: Not on file  Stress: Not on file  Social Connections: Not on file  Intimate Partner Violence: Not on file   History reviewed. No pertinent family history.    VITAL SIGNS BP (!) 153/84   Pulse (!) 106   Temp (!) 97.2 F (36.2 C)   Resp 20   Ht 5\' 11"  (1.803 m)   Wt 155 lb (70.3 kg)   SpO2 95%   BMI 21.62 kg/m   Outpatient Encounter Medications as of 09/23/2020  Medication Sig  . Amino Acids-Protein Hydrolys (FEEDING SUPPLEMENT, PRO-STAT SUGAR FREE 64,) LIQD Take 30 mLs by mouth 3 (three) times daily with meals. for albumin 2.1  . calcium carbonate (TUMS - DOSED IN MG ELEMENTAL CALCIUM) 500 MG chewable tablet Chew by mouth daily. 500 mg calcium (1,250 mg) tablet,chewable  . [EXPIRED] cefTRIAXone 250 mg/mL in lidocaine (with preservative) injection Inject 1,000 mg into the muscle daily. X 1 week  . Cholecalciferol (VITAMIN D3 PO) Take by mouth. 1250 mcg (50.000 units), oral, Once A Day on Wed, Take 1 tab PO daily on Wednesday. Do not crush or chew.  . enoxaparin (LOVENOX) 40 MG/0.4ML injection Inject 40 mg into the skin once.  . ferrous sulfate 325 (65 FE) MG EC tablet Take  325 mg by mouth once.  Marland Kitchen lisinopril (ZESTRIL) 10 MG tablet Take 10 mg by mouth 2 (two) times daily.  . magnesium hydroxide (MILK OF MAGNESIA) 400 MG/5ML suspension Take by mouth daily. 15 mL, oral, Once A Day  . meclizine (ANTIVERT) 12.5 MG tablet Take 12.5 mg by mouth 3 (three) times daily as needed for dizziness.  . Multiple Vitamins-Minerals (THEREMS-H) TABS Take by mouth. TAKE 1 TABLET BY MOUTH ONCE DAILY *USE HOUSE STOCK*  . polyethylene glycol (MIRALAX / GLYCOLAX) 17 g packet Take 17 g by mouth daily. Mix in 4 oz of water/juice  . UNABLE TO FIND Diet: Regular  . vitamin B-12 (CYANOCOBALAMIN) 500 MCG tablet Take 500 mcg by mouth daily.  . vitamin E 180 MG (400 UNITS) capsule Take 400 Units by mouth daily. *USE HOUSE STOCK*  . WIXELA INHUB 250-50 MCG/ACT AEPB Inhale 1 puff into the  lungs 2 (two) times daily.  . [DISCONTINUED] traMADol (ULTRAM) 50 MG tablet Take 1 tablet (50 mg total) by mouth every 6 (six) hours as needed for severe pain.  Marland Kitchen SODIUM CHLORIDE, EXTERNAL, 0.9 % SOLN Apply topically See admin instructions. 75 cc per hour; intravenous  Special Instructions: FOR 2 LITERS ONLY FOR ACUTE RENAL FAILURE  Every Shift; Day, Evening, Night   No facility-administered encounter medications on file as of 09/23/2020.     SIGNIFICANT DIAGNOSTIC EXAMS   PREVIOUS   09-08-20: KUB:  Progressive gaseous distension of the small bowel diffusely and evacuation of gas and stool within the colon suggesting changes of a distal partial or developing small bowel obstruction.  TODAY   09-21-20: Chest x-ray: bilateral opacities.   LABS REVIEWED:   08-30-20: wbc 5.2; hgb 10.4; hct 31.4; mcv 94.3 plt 185; glucose 100; bun 16; creat 1.15; k+ 3.5; na++ 138; ca 8.6 GFR>60 09-06-20: wbc 9.7; hgb 11.4; hct 34.1; mcv 92.4 plt 261; glucose 153; bun 49; creat 1.52; k+ 3.5 na++ 139; ca 8.7; GFR 46; liver normal albumin 2.8  09-08-20: glucose 125; bun 47; creat 1.33; k+ 3.6; na++ 142; ca 8.9; GFR 54 09-20-20: wbc 9.3; hgb 9,4; hct 30.2; mcv 98.4 plt 400; glucose 114; bun 55; creat 2.00; k+ 4.0; na++ 142; ca 7.6; GFR33; liver normal albumin 2.1 09-21-20: hgb 9.1; hct 29.1; glucose 114; bun 54; creat 1.99; k+ 4.2; na++ 142; ca 7.8; GFR 33   TODAY  09-21-20: sputum culture: proteus mirabilis; klebsiella pneumoniae: rocephin     Review of Systems  Reason unable to perform ROS: very HOH.     Physical Exam Constitutional:      General: He is not in acute distress.    Appearance: He is well-developed. He is not diaphoretic.  HENT:     Ears:     Comments: Very HOH Neck:     Thyroid: No thyromegaly.  Cardiovascular:     Rate and Rhythm: Normal rate and regular rhythm.     Heart sounds: Normal heart sounds.  Pulmonary:     Effort: Pulmonary effort is normal. No respiratory distress.      Breath sounds: Wheezing and rhonchi present.     Comments: Improving  Abdominal:     General: Bowel sounds are normal. There is no distension.     Palpations: Abdomen is soft.     Tenderness: There is no abdominal tenderness.     Comments:  Colostomy Right dorsal skin flap to abdomen with sutures intact; abdominal binder in place   Musculoskeletal:  General: Normal range of motion.     Right lower leg: No edema.     Left lower leg: No edema.  Lymphadenopathy:     Cervical: No cervical adenopathy.  Skin:    General: Skin is warm and dry.  Neurological:     Mental Status: He is alert. Mental status is at baseline.  Psychiatric:        Mood and Affect: Mood normal.     ASSESSMENT/ PLAN:  TODAY  1. Proteus mirabilis pneumonia 2. Klebsiella pneumoniae bilateral unspecified part of lung  Will have him complete his rocephin and will monitor his status.       Ok Edwards NP [facility]  Contact [phone] Monday through Friday 8am- 5pm  After hours call [phone]

## 2020-09-24 ENCOUNTER — Ambulatory Visit (INDEPENDENT_AMBULATORY_CARE_PROVIDER_SITE_OTHER): Payer: Medicare Other | Admitting: Plastic Surgery

## 2020-09-24 ENCOUNTER — Other Ambulatory Visit: Payer: Self-pay

## 2020-09-24 DIAGNOSIS — S61401A Unspecified open wound of right hand, initial encounter: Secondary | ICD-10-CM

## 2020-09-24 NOTE — Progress Notes (Signed)
Operative Note   DATE OF OPERATION: 09/24/2020  LOCATION:    SURGICAL DEPARTMENT: Plastic Surgery  PREOPERATIVE DIAGNOSES: Complex right hand wound status post groin flap reconstruction  POSTOPERATIVE DIAGNOSES:  same  PROCEDURE:  1. Division and inset of groin flap to right dorsal hand 2. Debridement of right hand wound totaling 8 x 4 cm including skin and subcutaneous tissue  SURGEON: Talmadge Coventry, MD  ANESTHESIA:  Local  COMPLICATIONS: None.   INDICATIONS FOR PROCEDURE:  The patient, Tony Fleming is a 80 y.o. male born on June 09, 1940, is here for treatment of complex right hand wound.  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  He presented to me initially with exposed tendon and metacarpals.  In effort to preserve his hand we elected to move forward with groin flap reconstruction.  This flap was applied 3 weeks ago.  He has been in a skilled nursing nursing facility for the past week or so.  He has had some mild respiratory infection and in order to facilitate his treatment and functional status we elected to move ahead with division and inset of the groin flap under local anesthesia in the office today. MRN: [id number]  CONSENT:  Informed consent was obtained directly from the patient. Risks, benefits and alternatives were fully discussed. Specific risks including but not limited to bleeding, infection, hematoma, seroma, scarring, pain, infection, wound healing problems, and need for further surgery were all discussed. The patient did have an ample opportunity to have questions answered to satisfaction.   DESCRIPTION OF PROCEDURE:  Local anesthesia was administered. The patient's operative site was prepped and draped in a sterile fashion. A time out was performed and all information was confirmed to be correct.  A solution of lidocaine with epinephrine and quarter percent Marcaine were diluted in saline to reach a volume of about 60 cc total  fluid.  This was injected around the pedicle attachment site in the right groin and around his hand.  This was given time to work.  I then cut across the pedicle with a 15 blade.  There was healthy bleeding coming from both sides.  It this was controlled with cautery.  I then tailored the skin edges of the pedicle side to close that defect is much as possible.  He did develop a wound dehiscence along his right flank and is much of the pedicle skin as possible was salvaged in order to cover this.  This was inset with 3-0 PDS sutures.  I then turned my attention to the right hand.  The colonized granulation tissue that had formed on the underside of the flap was debrided with the bevel of the 15 blade.  The flap skin was then tailored and thinned out the distal aspect of the flap to appropriately cover the defect.  Hemostasis was obtained with cautery.  The flap was then inset with a combination of interrupted and mattress 3-0 PDS sutures.  This gave her great on table result.  He was dressed with Xeroform and soft dressings.  The patient tolerated the procedure well.  There were no complications.  The plan will be for him to return to the skilled nursing facility to continue his rehabilitation.  I will plan to see him again in 2 to 3 weeks to check his wounds.  All of his questions were answered.

## 2020-09-27 ENCOUNTER — Ambulatory Visit: Admit: 2020-09-27 | Payer: Medicare Other | Admitting: Plastic Surgery

## 2020-09-27 SURGERY — CLOSURE, WOUND, USING MUSCLE FLAP
Anesthesia: General | Site: Groin

## 2020-09-29 ENCOUNTER — Ambulatory Visit: Payer: Medicare Other | Admitting: Plastic Surgery

## 2020-09-29 DIAGNOSIS — J150 Pneumonia due to Klebsiella pneumoniae: Secondary | ICD-10-CM | POA: Insufficient documentation

## 2020-09-29 DIAGNOSIS — J156 Pneumonia due to other aerobic Gram-negative bacteria: Secondary | ICD-10-CM | POA: Insufficient documentation

## 2020-09-30 ENCOUNTER — Encounter: Payer: Self-pay | Admitting: Adult Health

## 2020-09-30 ENCOUNTER — Non-Acute Institutional Stay (SKILLED_NURSING_FACILITY): Payer: Medicare Other | Admitting: Adult Health

## 2020-09-30 DIAGNOSIS — N1831 Chronic kidney disease, stage 3a: Secondary | ICD-10-CM | POA: Diagnosis not present

## 2020-09-30 DIAGNOSIS — E43 Unspecified severe protein-calorie malnutrition: Secondary | ICD-10-CM

## 2020-09-30 DIAGNOSIS — S61401D Unspecified open wound of right hand, subsequent encounter: Secondary | ICD-10-CM | POA: Diagnosis not present

## 2020-09-30 NOTE — Progress Notes (Signed)
Location:  [facility] Room Number: 131-P Place of Service:  SNF (31)   CODE STATUS: Full  Allergies  Allergen Reactions   Penicillins Rash    Tolerated ceftriaxone at OSH     Chief Complaint  Patient presents with   Acute Visit    Care plan meeting.    HPI:  We have come together for his care plan meeting. Family present   BIMS 6/15 mood 6/30: not sleeping well; anxious; decreased energy; some depression. He is extensive assist with dependent with his adls. He requires assist with his meals. He is incontinent of bladder; has colostomy there have been no falls. He is nonambulatory.  Therapy out of bed to chair with range of motion to hand.   .  Dietary: weight is 143.7 pounds  down 11.3 pounds since 09/04/2020. He is on a regular diet has a poor appetite. His family does not want a feeding tube placed. He has been treated for pneumonia this past month. He has had his right hand released this past month.    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)   Hypertension    Urinary incontinence     Past Surgical History:  Procedure Laterality Date   CATARACT EXTRACTION Bilateral    COLONOSCOPY     COLOSTOMY     DEBRIDEMENT AND CLOSURE WOUND N/A 08/30/2020   Procedure: Abdominal flap/closure;  Surgeon: [name], MD;  Location: [facility];  Service: Plastics;  Laterality: N/A;   ESOPHAGOGASTRODUODENOSCOPY     MUSCLE FLAP CLOSURE Right 08/30/2020   Procedure: right hand reconstruction;  Surgeon: [name], MD;  Location: [facility];  Service: Plastics;  Laterality: Right;  2 hours    Social  History   Socioeconomic History   Marital status: Single    Spouse name: Not on file   Number of children: Not on file   Years of education: Not on file   Highest education level: Not on file  Occupational History   Not on file  Tobacco Use   Smoking status: Never   Smokeless tobacco: Never  Vaping Use   Vaping Use: Never used  Substance and Sexual Activity   Alcohol use: Never   Drug use: Never   Sexual activity: Not Currently  Other Topics Concern   Not on file  Social History Narrative   Not on file   Social Determinants of Health   Financial Resource Strain: Not on file  Food Insecurity: Not on file  Transportation Needs: Not on file  Physical Activity: Not on file  Stress: Not on file  Social Connections: Not on file  Intimate Partner Violence: Not on file   History reviewed. No pertinent family history.    VITAL SIGNS BP (!) 142/47   Pulse 100   Temp 97.7 F (36.5 C)   Resp 20   Ht 5\' 11"  (1.803 m)   Wt 143 lb 11.2 oz (65.2 kg)   SpO2 96%   BMI 20.04 kg/m   Outpatient Encounter Medications as of 09/30/2020  Medication Sig  Amino Acids-Protein Hydrolys (FEEDING SUPPLEMENT, PRO-STAT SUGAR FREE 64,) LIQD Take 30 mLs by mouth 3 (three) times daily with meals. for albumin 2.1   calcium carbonate (TUMS - DOSED IN MG ELEMENTAL CALCIUM) 500 MG chewable tablet Chew by mouth daily. 500 mg calcium (1,250 mg) tablet,chewable   Cholecalciferol (VITAMIN D3 PO) Take by mouth. 1250 mcg (50.000 units), oral, Once A Day on Wed, Take 1 tab PO daily on Wednesday. Do not crush or chew.   collagenase (SANTYL) ointment Apply 1 application topically daily. Apply to coccyx wound per tx orders.   enoxaparin (LOVENOX) 40 MG/0.4ML injection Inject 40 mg into the skin once.   ferrous sulfate 325 (65 FE) MG EC tablet Take 325 mg by mouth once.   lisinopril (ZESTRIL) 10 MG tablet Take 10 mg by mouth 2 (two) times daily.   magnesium hydroxide (MILK OF MAGNESIA) 400 MG/5ML suspension  Take by mouth daily. 15 mL, oral, Once A Day   meclizine (ANTIVERT) 12.5 MG tablet Take 12.5 mg by mouth 3 (three) times daily as needed for dizziness.   Multiple Vitamins-Minerals (THEREMS-H) TABS Take by mouth. TAKE 1 TABLET BY MOUTH ONCE DAILY *USE HOUSE STOCK*   polyethylene glycol (MIRALAX / GLYCOLAX) 17 g packet Take 17 g by mouth daily. Mix in 4 oz of water/juice   traMADol (ULTRAM) 50 MG tablet Take 50 mg by mouth every 6 (six) hours as needed. As needed for pain rated 8-10/10.   UNABLE TO FIND Diet: Regular   vitamin B-12 (CYANOCOBALAMIN) 500 MCG tablet Take 500 mcg by mouth daily.   vitamin E 180 MG (400 UNITS) capsule Take 400 Units by mouth daily. *USE HOUSE STOCK*   WIXELA INHUB 250-50 MCG/ACT AEPB Inhale 1 puff into the lungs 2 (two) times daily.   [DISCONTINUED] SODIUM CHLORIDE, EXTERNAL, 0.9 % SOLN Apply topically See admin instructions. 75 cc per hour; intravenous  Special Instructions: FOR 2 LITERS ONLY FOR ACUTE RENAL FAILURE  Every Shift; Day, Evening, Night   No facility-administered encounter medications on file as of 09/30/2020.     SIGNIFICANT DIAGNOSTIC EXAMS   PREVIOUS   09-08-20: KUB:  Progressive gaseous distension of the small bowel diffusely and evacuation of gas and stool within the colon suggesting changes of a distal partial or developing small bowel obstruction.  09-21-20: Chest x-ray: bilateral opacities.   NO NEW EXAMS.   LABS REVIEWED:   08-30-20: wbc 5.2; hgb 10.4; hct 31.4; mcv 94.3 plt 185; glucose 100; bun 16; creat 1.15; k+ 3.5; na++ 138; ca 8.6 GFR>60 09-06-20: wbc 9.7; hgb 11.4; hct 34.1; mcv 92.4 plt 261; glucose 153; bun 49; creat 1.52; k+ 3.5 na++ 139; ca 8.7; GFR 46; liver normal albumin 2.8  09-08-20: glucose 125; bun 47; creat 1.33; k+ 3.6; na++ 142; ca 8.9; GFR 54 09-20-20: wbc 9.3; hgb 9,4; hct 30.2; mcv 98.4 plt 400; glucose 114; bun 55; creat 2.00; k+ 4.0; na++ 142; ca 7.6; GFR33; liver normal albumin 2.1 09-21-20: hgb 9.1; hct 29.1;  glucose 114; bun 54; creat 1.99; k+ 4.2; na++ 142; ca 7.8; GFR 33  09-21-20: sputum culture: proteus mirabilis; klebsiella pneumoniae: rocephin   NO NEW LABS.     Review of Systems  Reason unable to perform ROS: very HOH.   Physical Exam Constitutional:      General: He is not in acute distress.    Appearance: He is well-developed. He is not diaphoretic.  HENT:     Ears:     Comments: Very HOH  Neck:     Thyroid: No thyromegaly.  Cardiovascular:     Rate and Rhythm: Normal rate and regular rhythm.     Heart sounds: Normal heart sounds.  Pulmonary:     Effort: Pulmonary effort is normal. No respiratory distress.     Breath sounds: Normal breath sounds.  Abdominal:     General: Bowel sounds are normal. There is no distension.     Palpations: Abdomen is soft.     Tenderness: There is no abdominal tenderness.     Comments: Colostomy  Musculoskeletal:        General: Normal range of motion.     Cervical back: Neck supple.     Right lower leg: No edema.     Left lower leg: No edema.     Comments: Right arm in ace wrap   Lymphadenopathy:     Cervical: No cervical adenopathy.  Skin:    General: Skin is warm and dry.  Neurological:     Mental Status: He is alert. Mental status is at baseline.  Psychiatric:        Mood and Affect: Mood normal.     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  Will continue to monitor his status.  Goals of care are: no feeding tube; no CPR    Time spent with patient 40 minutes:(20 minutes spent with advanced directives)  goals of care; nutritional status; medications; therapy; code status and tube feeding.    Ok Edwards NP [facility]  Contact [phone] Monday through Friday 8am- 5pm  After  hours call [phone]

## 2020-10-07 ENCOUNTER — Encounter: Payer: Medicare Other | Admitting: Plastic Surgery

## 2020-10-07 ENCOUNTER — Ambulatory Visit: Payer: Medicare Other | Admitting: Plastic Surgery

## 2020-10-08 ENCOUNTER — Encounter (HOSPITAL_COMMUNITY)
Admission: RE | Admit: 2020-10-08 | Discharge: 2020-10-08 | Disposition: A | Discharge status: Home / Self Care | Payer: Medicare Other | Source: Skilled Nursing Facility | Attending: Adult Health | Admitting: Adult Health

## 2020-10-08 DIAGNOSIS — I10 Essential (primary) hypertension: Secondary | ICD-10-CM | POA: Insufficient documentation

## 2020-10-08 LAB — BASIC METABOLIC PANEL
Anion gap: 8 (ref 5–15)
BUN: 37 mg/dL — ABNORMAL HIGH (ref 8–23)
CO2: 28 mmol/L (ref 22–32)
Calcium: 8.3 mg/dL — ABNORMAL LOW (ref 8.9–10.3)
Chloride: 109 mmol/L (ref 98–111)
Creatinine, Ser: 1.48 mg/dL — ABNORMAL HIGH (ref 0.61–1.24)
GFR, Estimated: 47 mL/min — ABNORMAL LOW (ref >60)
Glucose, Bld: 112 mg/dL — ABNORMAL HIGH (ref 70–99)
Potassium: 4 mmol/L (ref 3.5–5.1)
Sodium: 145 mmol/L (ref 135–145)

## 2020-10-12 ENCOUNTER — Telehealth: Payer: Self-pay

## 2020-10-12 NOTE — Telephone Encounter (Signed)
09/24/20- orders for wound care/dressing given by Dr. Claudia Desanctis As follows:  Right abdomen /groin=  Xeroform, gauze dressing & tape Change daily  Right hand= Xeroform, gauze dressing, kerlix, ace wrap,- to elbow/keep fingers free.  Change daily  Signed copy given to transportation team - from the [facility] will be scanned into chart

## 2020-10-20 ENCOUNTER — Ambulatory Visit (INDEPENDENT_AMBULATORY_CARE_PROVIDER_SITE_OTHER): Payer: Medicare Other | Admitting: Plastic Surgery

## 2020-10-20 DIAGNOSIS — S61401A Unspecified open wound of right hand, initial encounter: Secondary | ICD-10-CM

## 2020-10-20 NOTE — Progress Notes (Signed)
Patient presents about 3 weeks postop from division and inset of groin flap to the dorsal aspect of his hand.  He continues to be in a skilled nursing facility and has not made a lot of progress in terms of his functional status.  The donor site on his right abdomen and groin area looks to be healing well.  There was a dehiscence in this area but its shrinking and is down to about 2 x 3 cm in size of an open area with healthy granulation tissue at the base.  The majority of the flap on his hand is viable.  It looks like the last centimeter over the very radial aspect of his hand may have at least some partial necrosis as there is a scab over that area.  My hope is that this will granulate in and time and I am planning to leave it in place as a biologic dressing.  We will continue wound care on this with Xeroform and a small wrap and follow-up with him again in around 3 weeks time.  All of his questions were answered.

## 2020-10-21 ENCOUNTER — Encounter: Payer: Self-pay | Admitting: Adult Health

## 2020-10-21 ENCOUNTER — Non-Acute Institutional Stay (SKILLED_NURSING_FACILITY): Payer: Medicare Other | Admitting: Adult Health

## 2020-10-21 DIAGNOSIS — C44622 Squamous cell carcinoma of skin of right upper limb, including shoulder: Secondary | ICD-10-CM | POA: Diagnosis not present

## 2020-10-21 DIAGNOSIS — R627 Adult failure to thrive: Secondary | ICD-10-CM

## 2020-10-21 DIAGNOSIS — E43 Unspecified severe protein-calorie malnutrition: Secondary | ICD-10-CM

## 2020-10-21 DIAGNOSIS — N1831 Chronic kidney disease, stage 3a: Secondary | ICD-10-CM

## 2020-10-21 NOTE — Progress Notes (Signed)
Location:  [facility] Room Number: 454 Place of Service:  SNF (31)   CODE STATUS: dnr  Allergies  Allergen Reactions  . Penicillins Rash    Tolerated ceftriaxone at OSH     Chief Complaint  Patient presents with  . Medical Management of Chronic Issues       Failure to thrive in adult:     Squamous cell carcinoma right dorsal hand:   Protein calorie malnutrition severe    HPI:  He is a 80 year old long term resident of this facility being seen for the management of his chronic illnesses: Failure to thrive in adult:     Squamous cell carcinoma right dorsal hand:   Protein calorie malnutrition severe. He is slowly declining; with poor po intake; will need to speak with family regarding hospice care.   Past Medical History:  Diagnosis Date  . Anemia   . Arthritis   . Asthma   . 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 Hypertension   . Urinary incontinence     Past Surgical History:  Procedure Laterality Date  . CATARACT EXTRACTION Bilateral   . COLONOSCOPY    . COLOSTOMY    . DEBRIDEMENT AND CLOSURE WOUND N/A 08/30/2020   Procedure: Abdominal flap/closure;  Surgeon: [name], MD;  Location: [facility];  Service: Plastics;  Laterality: N/A;  . ESOPHAGOGASTRODUODENOSCOPY    . MUSCLE FLAP CLOSURE Right 08/30/2020   Procedure: right hand reconstruction;  Surgeon: [name], MD;  Location: [facility];  Service: Plastics;  Laterality: Right;  2 hours    Social History   Socioeconomic History  . Marital status: Single    Spouse name: Not on file  . Number of children: Not on file  . Years of education: Not on file  . Highest education level: Not on file  Occupational History  . Not on file  Tobacco Use  . Smoking status: Never  . Smokeless tobacco: Never  Vaping Use  . Vaping Use: Never used  Substance and Sexual Activity  . Alcohol use: Never  . Drug use: Never  . Sexual activity: Not Currently  Other Topics Concern  . Not on file  Social  History Narrative  . Not on file   Social Determinants of Health   Financial Resource Strain: Not on file  Food Insecurity: Not on file  Transportation Needs: Not on file  Physical Activity: Not on file  Stress: Not on file  Social Connections: Not on file  Intimate Partner Violence: Not on file   No family history on file.    VITAL SIGNS BP 118/68   Pulse 76   Temp 98.4 F (36.9 C)   Resp 20   Ht 5\' 11"  (1.803 m)   Wt 143 lb 11.2 oz (65.2 kg)   SpO2 96%   BMI 20.04 kg/m   Outpatient Encounter Medications as of 10/21/2020  Medication Sig  . Amino Acids-Protein Hydrolys (FEEDING SUPPLEMENT, PRO-STAT SUGAR FREE 64,) LIQD Take 30 mLs by mouth 3 (three) times daily with meals. for albumin 2.1  . calcium carbonate (TUMS - DOSED IN MG ELEMENTAL CALCIUM) 500 MG chewable tablet Chew by mouth daily. 500 mg calcium (1,250 mg) tablet,chewable  . Cholecalciferol (VITAMIN D3 PO) Take by mouth. 1250 mcg (50.000 units), oral, Once A Day on Wed, Take 1 tab PO daily on Wednesday. Do not crush or chew.  . collagenase (SANTYL) ointment Apply 1 application topically daily. Apply  to coccyx wound per tx orders.  . enoxaparin (LOVENOX) 40 MG/0.4ML injection Inject 40 mg into the skin once.  . ferrous sulfate 325 (65 FE) MG EC tablet Take 325 mg by mouth once.  Marland Kitchen lisinopril (ZESTRIL) 10 MG tablet Take 10 mg by mouth 2 (two) times daily.  . magnesium hydroxide (MILK OF MAGNESIA) 400 MG/5ML suspension Take by mouth daily. 15 mL, oral, Once A Day  . meclizine (ANTIVERT) 12.5 MG tablet Take 12.5 mg by mouth 3 (three) times daily as needed for dizziness.  . Multiple Vitamins-Minerals (THEREMS-H) TABS Take by mouth. TAKE 1 TABLET BY MOUTH ONCE DAILY *USE HOUSE STOCK*  . polyethylene glycol (MIRALAX / GLYCOLAX) 17 g packet Take 17 g by mouth daily. Mix in 4 oz of water/juice  . traMADol (ULTRAM) 50 MG tablet Take 50 mg by mouth every 6 (six) hours as needed. As needed for pain rated 8-10/10.  Marland Kitchen UNABLE TO  FIND Diet: Regular  . vitamin B-12 (CYANOCOBALAMIN) 500 MCG tablet Take 500 mcg by mouth daily.  . vitamin E 180 MG (400 UNITS) capsule Take 400 Units by mouth daily. *USE HOUSE STOCK*  . WIXELA INHUB 250-50 MCG/ACT AEPB Inhale 1 puff into the lungs 2 (two) times daily.   No facility-administered encounter medications on file as of 10/21/2020.     SIGNIFICANT DIAGNOSTIC EXAMS   PREVIOUS   09-08-20: KUB:  Progressive gaseous distension of the small bowel diffusely and evacuation of gas and stool within the colon suggesting changes of a distal partial or developing small bowel obstruction.  09-21-20: Chest x-ray: bilateral opacities.  NO NEW EXAMS.    LABS REVIEWED:   08-30-20: wbc 5.2; hgb 10.4; hct 31.4; mcv 94.3 plt 185; glucose 100; bun 16; creat 1.15; k+ 3.5; na++ 138; ca 8.6 GFR>60 09-06-20: wbc 9.7; hgb 11.4; hct 34.1; mcv 92.4 plt 261; glucose 153; bun 49; creat 1.52; k+ 3.5 na++ 139; ca 8.7; GFR 46; liver normal albumin 2.8  09-08-20: glucose 125; bun 47; creat 1.33; k+ 3.6; na++ 142; ca 8.9; GFR 54 09-20-20: wbc 9.3; hgb 9,4; hct 30.2; mcv 98.4 plt 400; glucose 114; bun 55; creat 2.00; k+ 4.0; na++ 142; ca 7.6; GFR33; liver normal albumin 2.1 09-21-20: hgb 9.1; hct 29.1; glucose 114; bun 54; creat 1.99; k+ 4.2; na++ 142; ca 7.8; GFR 33  09-21-20: sputum culture: proteus mirabilis; klebsiella pneumoniae: rocephin  NO NEW LABS.   Review of Systems  Reason unable to perform ROS: very HOH.   Physical Exam Constitutional:      General: He is not in acute distress.    Appearance: He is well-developed. He is not diaphoretic.  HENT:     Ears:     Comments: Very HOH Neck:     Thyroid: No thyromegaly.  Cardiovascular:     Rate and Rhythm: Normal rate and regular rhythm.     Pulses: Normal pulses.     Heart sounds: Normal heart sounds.  Pulmonary:     Effort: Pulmonary effort is normal. No respiratory distress.     Breath sounds: Normal breath sounds.  Abdominal:     General:  Bowel sounds are normal. There is no distension.     Palpations: Abdomen is soft.     Tenderness: There is no abdominal tenderness.     Comments: Colostomy   Musculoskeletal:     Cervical back: Neck supple.     Right lower leg: No edema.     Left lower leg: No edema.  Comments: Right arm in ace   Lymphadenopathy:     Cervical: No cervical adenopathy.  Skin:    General: Skin is warm and dry.     Comments: Dressing intact   Neurological:     Mental Status: He is alert. Mental status is at baseline.  Psychiatric:        Mood and Affect: Mood normal.     ASSESSMENT/ PLAN:  TODAY  Failure to thrive in adult: he continues with poor intake  2. Squamous cell carcinoma right dorsal hand: without change has had hand released   3. Protein calorie malnutrition severe: albumin 2.8 will continue supplements as directed.   PREVIOUS   4. Chronic constipation / postoperative ileus: is stable will continue MOM 15 mL daily and miralax daily   5. Essential hypertension: is stable b/p 139/79 will continue lisinopril 10 mg daily   6. Vitamin B 12 deficiency: is stable will continue 500 mcg daily   7. Vitamin D deficiency: is stable will continue vitamin d 50,000 units weekly   8. Chronic asthma: is stable will continue advair 250/50 mcg 1 puff twice daily     Ok Edwards NP [facility]  Contact [phone] Monday through Friday 8am- 5pm  After hours call [phone]

## 2020-10-29 DIAGNOSIS — R627 Adult failure to thrive: Secondary | ICD-10-CM | POA: Insufficient documentation

## 2020-11-17 ENCOUNTER — Ambulatory Visit: Payer: Medicare Other | Admitting: Plastic Surgery

## 2020-11-22 DEATH — deceased

## 2022-05-06 IMAGING — DX DG ABDOMEN 1V
1 series · 1 of 1 positions shown · non-contrast
Comparison: None.

CLINICAL DATA: Abdomen enlarged.

EXAM:
ABDOMEN - 1 VIEW

[abdomen]
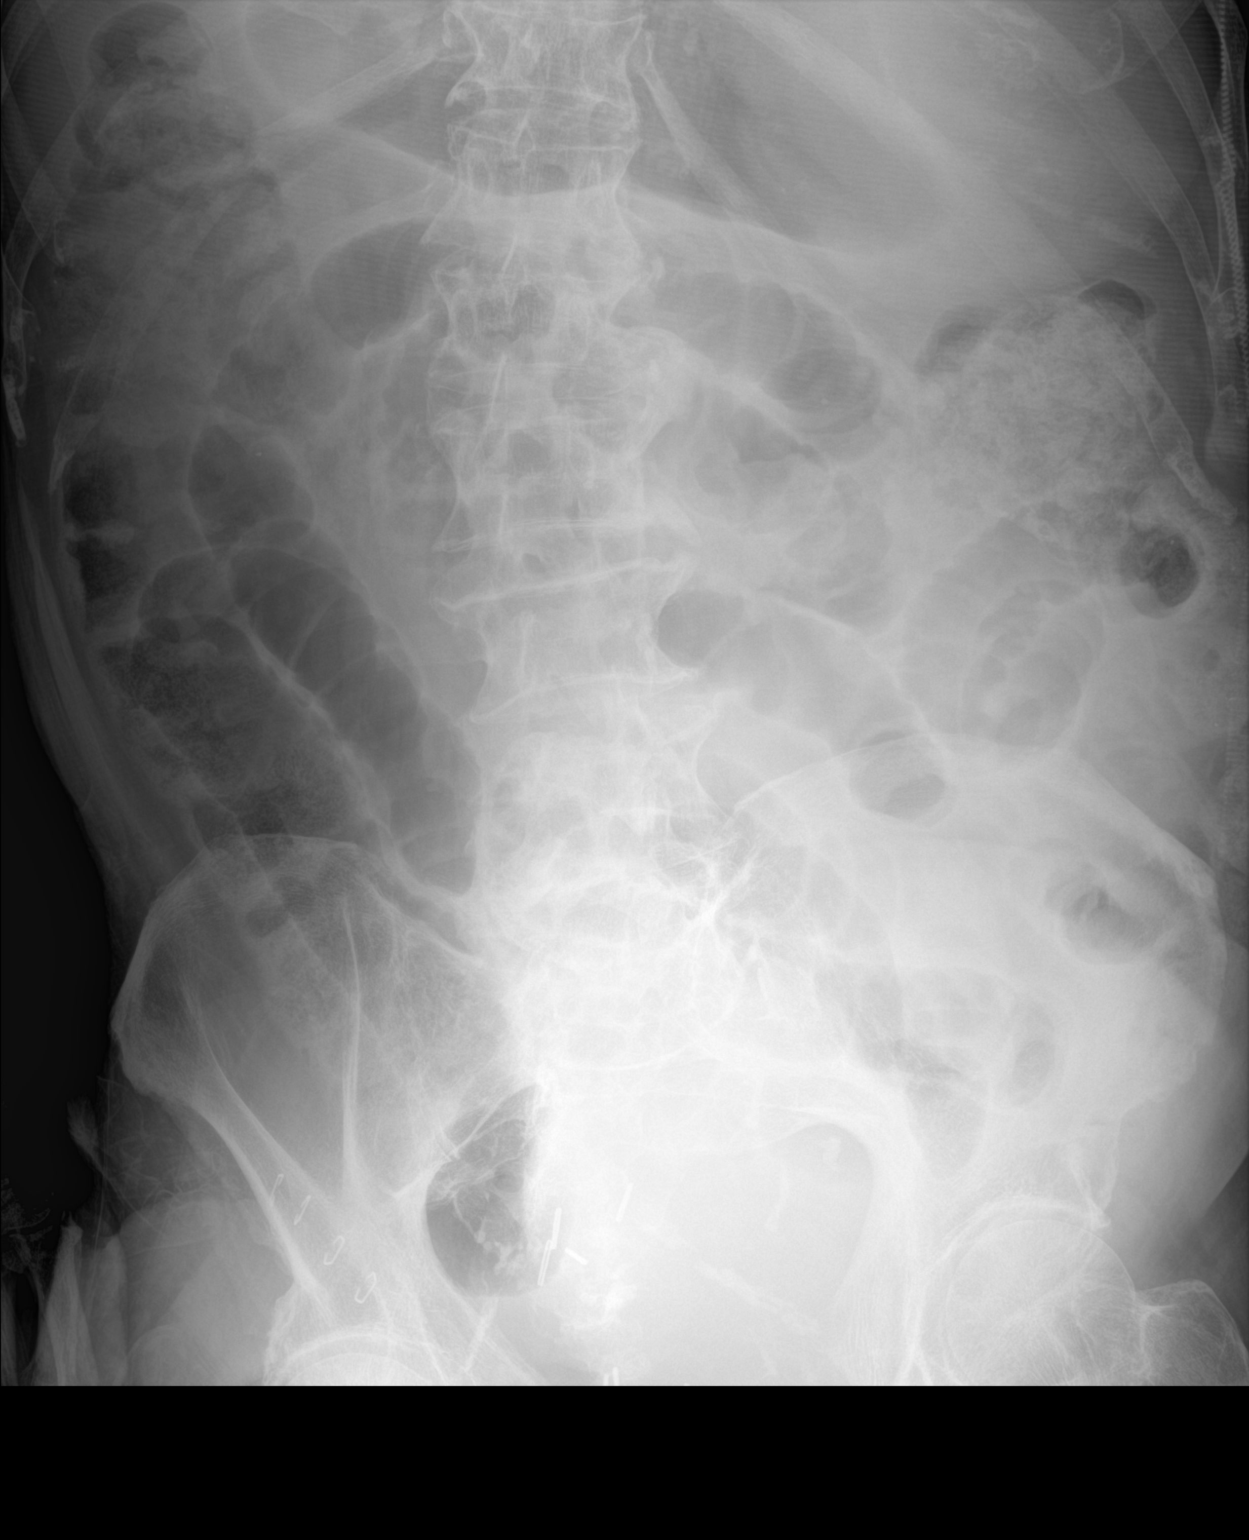

[1 of 1 positions shown; findings below may reference images not displayed]

FINDINGS: Multiple dilated air-filled small bowel loops are seen throughout
the abdomen. A moderate amount of stool is seen throughout the large
bowel. No radio-opaque calculi or other significant radiographic
abnormality are seen. Radiopaque surgical clips are seen overlying
the pelvis.
IMPRESSION: Findings consistent with a small bowel obstruction versus ileus.

## 2022-05-08 IMAGING — DX DG ABDOMEN 1V
1 series · 1 of 1 positions shown · non-contrast
Comparison: 09/06/2020

CLINICAL DATA: Nausea

EXAM:
ABDOMEN - 1 VIEW

[abdomen supine]
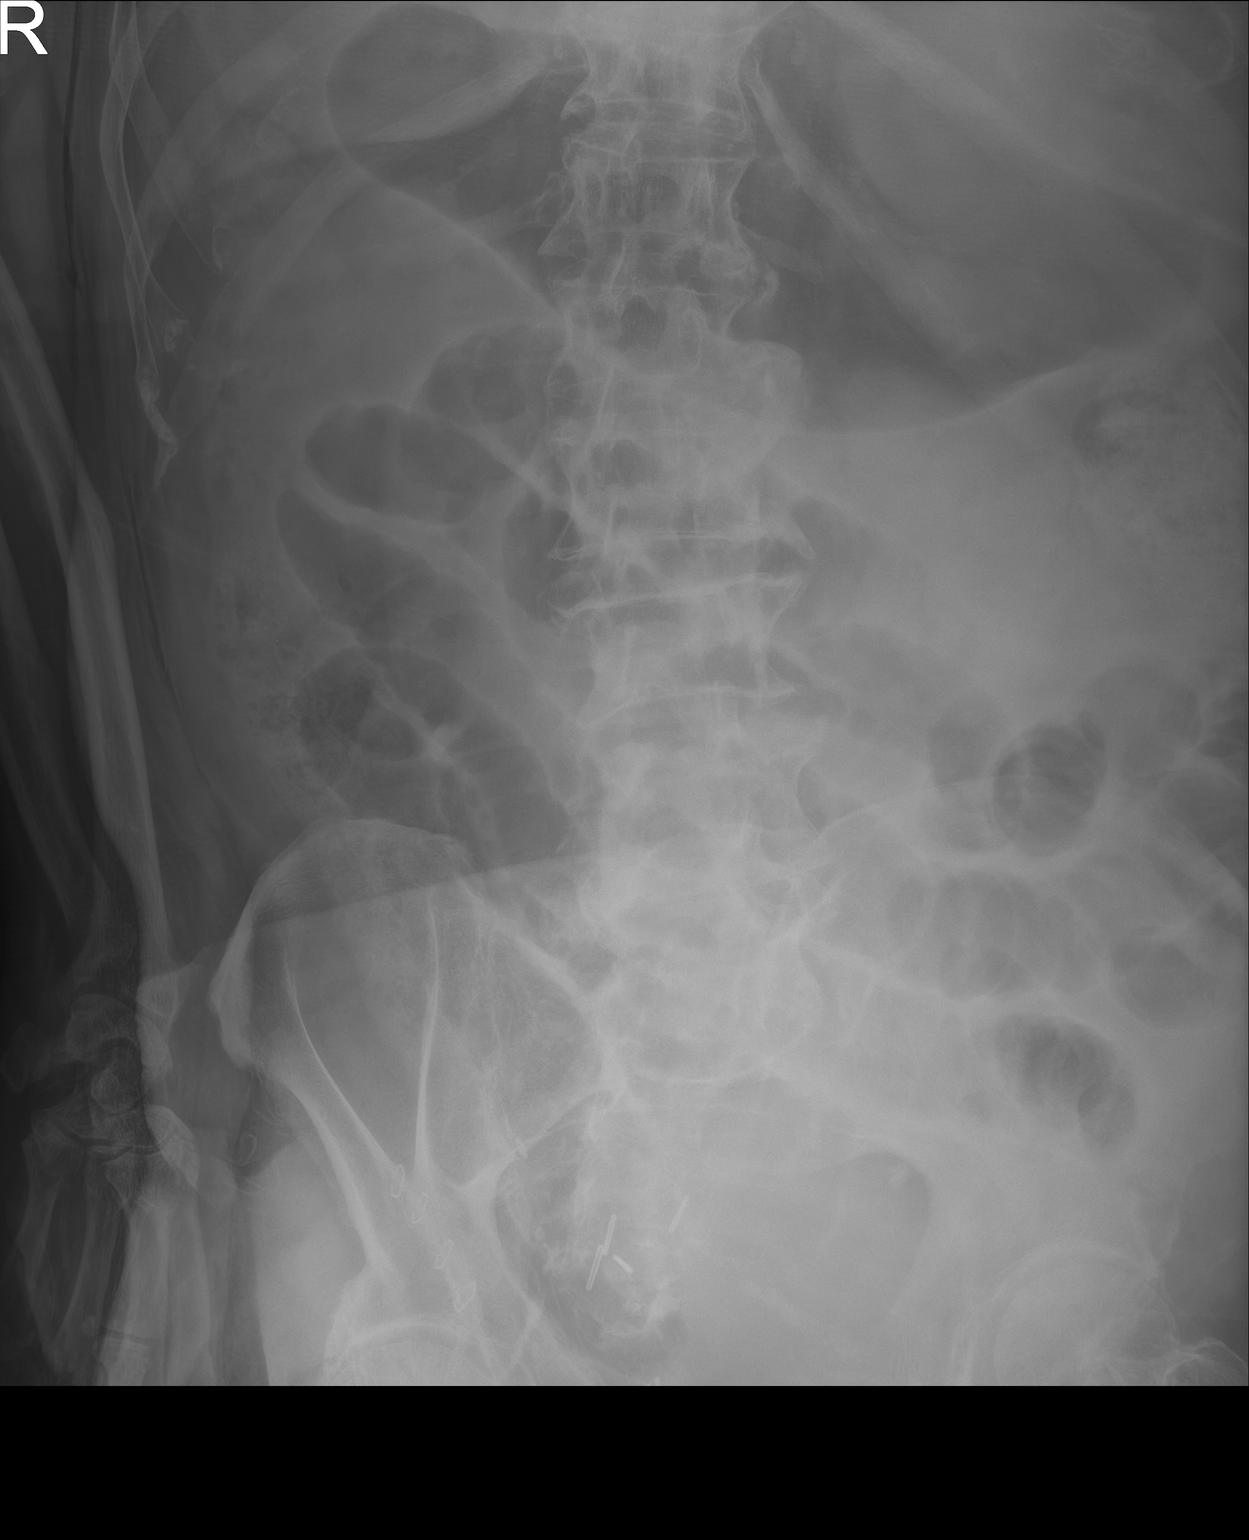

[1 of 1 positions shown; findings below may reference images not displayed]

FINDINGS: There is mild gaseous distension of the stomach. Multiple gas-filled
prominent loops of small bowel are again seen throughout the
abdomen. There is diminishing gas and stool seen throughout the
colon. Together, the findings suggest changes of a distal partial or
developing small bowel obstruction. No gross free intraperitoneal
gas. Moderate stool seen within the distal colon. Surgical clips are
seen within the pelvis and surgical skin staples noted overlying the
right iliac crest.
IMPRESSION: Progressive gaseous distension of the small bowel diffusely and
evacuation of gas and stool within the colon suggesting changes of a
distal partial or developing small bowel obstruction.
